# Patient Record
Sex: Male | Born: 1970 | Race: Black or African American | Hispanic: No | Marital: Single | Smoking: Current every day smoker
Health system: Southern US, Community
[De-identification: ages and names within clinical notes are randomized; demographics above are authoritative.]

## PROBLEM LIST (undated history)

## (undated) DIAGNOSIS — I10 Essential (primary) hypertension: Secondary | ICD-10-CM

---

## 2000-01-27 ENCOUNTER — Emergency Department (HOSPITAL_COMMUNITY): Admission: EM | Admit: 2000-01-27 | Discharge: 2000-01-27 | Payer: Self-pay | Admitting: Emergency Medicine

## 2000-01-27 ENCOUNTER — Encounter: Payer: Self-pay | Admitting: Emergency Medicine

## 2000-05-06 ENCOUNTER — Ambulatory Visit (HOSPITAL_COMMUNITY): Admission: RE | Admit: 2000-05-06 | Discharge: 2000-05-06 | Payer: Self-pay | Admitting: Orthopaedic Surgery

## 2000-05-20 ENCOUNTER — Ambulatory Visit (HOSPITAL_COMMUNITY): Admission: RE | Admit: 2000-05-20 | Discharge: 2000-05-20 | Payer: Self-pay | Admitting: Orthopaedic Surgery

## 2000-08-28 ENCOUNTER — Emergency Department (HOSPITAL_COMMUNITY): Admission: EM | Admit: 2000-08-28 | Discharge: 2000-08-28 | Payer: Self-pay | Admitting: Emergency Medicine

## 2000-08-28 ENCOUNTER — Encounter: Payer: Self-pay | Admitting: Emergency Medicine

## 2000-08-31 ENCOUNTER — Inpatient Hospital Stay (HOSPITAL_COMMUNITY): Admission: RE | Admit: 2000-08-31 | Discharge: 2000-09-01 | Payer: Self-pay | Admitting: Orthopaedic Surgery

## 2016-08-16 HISTORY — PX: HERNIA REPAIR: SHX51

## 2019-07-26 ENCOUNTER — Encounter (HOSPITAL_BASED_OUTPATIENT_CLINIC_OR_DEPARTMENT_OTHER): Payer: Self-pay | Admitting: *Deleted

## 2019-07-26 ENCOUNTER — Emergency Department (HOSPITAL_BASED_OUTPATIENT_CLINIC_OR_DEPARTMENT_OTHER): Payer: 59

## 2019-07-26 ENCOUNTER — Other Ambulatory Visit: Payer: Self-pay

## 2019-07-26 ENCOUNTER — Emergency Department (HOSPITAL_BASED_OUTPATIENT_CLINIC_OR_DEPARTMENT_OTHER)
Admission: EM | Admit: 2019-07-26 | Discharge: 2019-07-27 | Disposition: A | Discharge status: Home / Self Care | Payer: 59 | Attending: Emergency Medicine | Admitting: Emergency Medicine

## 2019-07-26 DIAGNOSIS — M5030 Other cervical disc degeneration, unspecified cervical region: Secondary | ICD-10-CM | POA: Insufficient documentation

## 2019-07-26 DIAGNOSIS — Y33XXXA Other specified events, undetermined intent, initial encounter: Secondary | ICD-10-CM | POA: Diagnosis not present

## 2019-07-26 DIAGNOSIS — Y929 Unspecified place or not applicable: Secondary | ICD-10-CM | POA: Diagnosis not present

## 2019-07-26 DIAGNOSIS — Y939 Activity, unspecified: Secondary | ICD-10-CM | POA: Diagnosis not present

## 2019-07-26 DIAGNOSIS — F17210 Nicotine dependence, cigarettes, uncomplicated: Secondary | ICD-10-CM | POA: Diagnosis not present

## 2019-07-26 DIAGNOSIS — S161XXA Strain of muscle, fascia and tendon at neck level, initial encounter: Secondary | ICD-10-CM | POA: Diagnosis not present

## 2019-07-26 DIAGNOSIS — Y999 Unspecified external cause status: Secondary | ICD-10-CM | POA: Insufficient documentation

## 2019-07-26 DIAGNOSIS — M25512 Pain in left shoulder: Secondary | ICD-10-CM

## 2019-07-26 DIAGNOSIS — S199XXA Unspecified injury of neck, initial encounter: Secondary | ICD-10-CM | POA: Diagnosis present

## 2019-07-26 MED ORDER — DIAZEPAM 5 MG PO TABS
5.0000 mg | ORAL_TABLET | Freq: Once | ORAL | Status: AC
  Administered 2019-07-26: 5 mg via ORAL
  Filled 2019-07-26: qty 1

## 2019-07-26 MED ORDER — KETOROLAC TROMETHAMINE 30 MG/ML IJ SOLN
30.0000 mg | Freq: Once | INTRAMUSCULAR | Status: AC
  Administered 2019-07-26: 23:00:00 30 mg via INTRAMUSCULAR
  Filled 2019-07-26: qty 1

## 2019-07-26 NOTE — ED Triage Notes (Signed)
Neck, shoulder and head pain x 4 days. Painful when he attempts to turn his head from side to side.

## 2019-07-27 MED ORDER — METHOCARBAMOL 500 MG PO TABS: 500.0000 mg | ORAL_TABLET | Freq: Three times a day (TID) | ORAL | 0 refills | Status: DC | PRN

## 2019-07-27 MED ORDER — DICLOFENAC SODIUM 1 % EX GEL: 2.0000 g | Freq: Four times a day (QID) | CUTANEOUS | 0 refills | Status: DC | PRN

## 2019-07-27 MED ORDER — IBUPROFEN 800 MG PO TABS: 800.0000 mg | ORAL_TABLET | Freq: Three times a day (TID) | ORAL | 0 refills | Status: DC | PRN

## 2019-07-27 MED ORDER — PREDNISONE 20 MG PO TABS: 40.0000 mg | ORAL_TABLET | Freq: Every day | ORAL | 0 refills | Status: AC

## 2019-07-27 NOTE — Discharge Instructions (Signed)

## 2019-07-27 NOTE — ED Provider Notes (Signed)
Emergency Department Provider Note   I have reviewed the triage vital signs and the nursing notes.   HISTORY  Chief Complaint No chief complaint on file.   HPI Adam Wood is a 48 y.o. male with no significant past medical history presents to the emergency department with neck, shoulder pain worse on the left.  Symptoms have been persistent over the past 4 days and are worse with movement or touching the area.  Patient denies any rash, fever, headaches.  Patient does have a physical job but denies any obvious injury at work or otherwise.  He denies any weakness or numbness in the upper or lower extremities.  No vision changes.  No radiation of symptoms or other modifying factors.  History reviewed. No pertinent past medical history.  There are no problems to display for this patient.   History reviewed. No pertinent surgical history.  Allergies Patient has no known allergies.  No family history on file.  Social History Social History   Tobacco Use  . Smoking status: Current Every Day Smoker  . Smokeless tobacco: Never Used  Substance Use Topics  . Alcohol use: Yes  . Drug use: Not Currently    Review of Systems  Constitutional: No fever/chills Cardiovascular: Denies chest pain. Respiratory: Denies shortness of breath. Gastrointestinal: No abdominal pain.  Musculoskeletal: Positive neck and left shoulder pain.  Skin: Negative for rash. Neurological: Negative for headaches, focal weakness or numbness.  10-point ROS otherwise negative.  ____________________________________________   PHYSICAL EXAM:  VITAL SIGNS: ED Triage Vitals  Enc Vitals Group     BP 07/26/19 2143 (!) 175/125     Pulse Rate 07/26/19 2143 80     Resp 07/26/19 2143 18     Temp 07/26/19 2143 98.7 F (37.1 C)     Temp Source 07/26/19 2143 Oral     SpO2 07/26/19 2143 99 %     Weight 07/26/19 2141 150 lb (68 kg)     Height 07/26/19 2141 5\' 8"  (1.727 m)   Constitutional: Alert and  oriented. Well appearing and in no acute distress. Eyes: Conjunctivae are normal.  Head: Atraumatic. Nose: No congestion/rhinnorhea. Mouth/Throat: Mucous membranes are moist.  Neck: No stridor. No cervical spine tenderness to palpation. Positive left para-cervical spine tenderness.  Cardiovascular: Normal rate, regular rhythm. Good peripheral circulation. Grossly normal heart sounds.   Respiratory: Normal respiratory effort.  No retractions. Lungs CTAB. Gastrointestinal: No distention.  Musculoskeletal: Patient with left paracervical and posterior shoulder tenderness.  The muscles of the left posterior shoulder feel tense/tight.  No rash appreciated.  Normal range of motion of the shoulder with some pain on movement.  No tenderness in the left upper extremity more distal. Neurologic:  Normal speech and language. No gross focal neurologic deficits are appreciated.  Skin:  Skin is warm, dry and intact. No rash noted.  ____________________________________________  EKG   EKG Interpretation  Date/Time:  Thursday July 26 2019 21:45:59 EST Ventricular Rate:  76 PR Interval:  152 QRS Duration: 80 QT Interval:  360 QTC Calculation: 405 R Axis:   -88 Text Interpretation: Normal sinus rhythm Left axis deviation Nonspecific ST changes. No prior EKG for comparison. No STEMI Confirmed by Alona BeneLong, Gladyce Mcray [phone]([id number]) on 07/26/2019 9:50:56 PM       ____________________________________________  RADIOLOGY  CT Cervical Spine Wo Contrast  Result Date: 07/26/2019 CLINICAL DATA:  Cervical neck, head, and shoulder pain for 4 days. Pain when turning head side to side. EXAM: CT CERVICAL SPINE WITHOUT CONTRAST TECHNIQUE:  Multidetector CT imaging of the cervical spine was performed without intravenous contrast. Multiplanar CT image reconstructions were also generated. COMPARISON:  None. FINDINGS: Alignment: Straightening of normal lordosis. No listhesis. Skull base and vertebrae: No acute fracture. No primary  bone lesion or focal pathologic process. Soft tissues and spinal canal: No prevertebral fluid or swelling. No visible canal hematoma. Disc levels: Disc space narrowing and endplate spurring from C4-C5 through C6-C7. There is left neural foraminal stenosis from C3-C4 through C6-C7. There is right foraminal stenosis at C5-C6 and C6-C7. No bony canal narrowing. Upper chest: Negative. Other: None. IMPRESSION: 1. Multilevel degenerative disc disease. Neural foraminal stenosis from C3-C4 through C6-C7 on the left. Neural foraminal stenosis on the right at C5-C6 and C6-C7. No bony canal narrowing. 2. Straightening of normal lordosis may be due to positioning or muscle spasm. 3. No acute osseous abnormality. Electronically Signed   By: Narda Rutherford M.D.   On: 07/26/2019 23:27   DG Shoulder Left  Result Date: 07/26/2019 CLINICAL DATA:  Shoulder pain for several days, no known injury, initial encounter EXAM: LEFT SHOULDER - 2+ VIEW COMPARISON:  None. FINDINGS: Degenerative changes of the acromioclavicular joint are seen. No acute fracture or dislocation is noted. No soft tissue abnormality is seen. No other focal abnormality is noted. IMPRESSION: No acute abnormality seen. Electronically Signed   By: Alcide Clever M.D.   On: 07/26/2019 23:25    ____________________________________________   PROCEDURES  Procedure(s) performed:   Procedures  None ____________________________________________   INITIAL IMPRESSION / ASSESSMENT AND PLAN / ED COURSE  Pertinent labs & imaging results that were available during my care of the patient were reviewed by me and considered in my medical decision making (see chart for details).   Patient presents to the emergency department with left neck and left shoulder pain.  No obvious injury.  No fevers to suspect infectious etiology.  EKG ordered in triage which was reviewed with no acute ischemic findings.  My suspicion for atypical ACS is exceedingly low.  The patient's  pain is reproducible on exam and with movement.  Patient given Valium and Toradol in the emergency department with mild improvement.  Will discharge home with Robaxin and topical pain medications.  Patient to follow-up with sports medicine if symptoms persist.  Also provided a work note.   ____________________________________________  FINAL CLINICAL IMPRESSION(S) / ED DIAGNOSES  Final diagnoses:  Strain of neck muscle, initial encounter  Acute pain of left shoulder  DDD (degenerative disc disease), cervical     MEDICATIONS GIVEN DURING THIS VISIT:  Medications  ketorolac (TORADOL) 30 MG/ML injection 30 mg (30 mg Intramuscular Given 07/26/19 2244)  diazepam (VALIUM) tablet 5 mg (5 mg Oral Given 07/26/19 2244)     NEW OUTPATIENT MEDICATIONS STARTED DURING THIS VISIT:  Discharge Medication List as of 07/27/2019 12:20 AM    START taking these medications   Details  diclofenac Sodium (VOLTAREN) 1 % GEL Apply 2 g topically 4 (four) times daily as needed., Starting Fri 07/27/2019, Print    ibuprofen (ADVIL) 800 MG tablet Take 1 tablet (800 mg total) by mouth every 8 (eight) hours as needed for moderate pain., Starting Fri 07/27/2019, Print    methocarbamol (ROBAXIN) 500 MG tablet Take 1 tablet (500 mg total) by mouth every 8 (eight) hours as needed for muscle spasms., Starting Fri 07/27/2019, Print    predniSONE (DELTASONE) 20 MG tablet Take 2 tablets (40 mg total) by mouth daily for 5 days., Starting Fri 07/27/2019, Until Wed 08/01/2019,  Print        Note:  This document was prepared using Dragon voice recognition software and may include unintentional dictation errors.  Nanda Quinton, MD, [facility] Emergency Medicine    Kohl Polinsky, Wonda Old[facility], MD 07/27/19 [phone]

## 2019-08-03 ENCOUNTER — Other Ambulatory Visit: Payer: Self-pay

## 2019-08-03 ENCOUNTER — Encounter (HOSPITAL_BASED_OUTPATIENT_CLINIC_OR_DEPARTMENT_OTHER): Payer: Self-pay | Admitting: *Deleted

## 2019-08-03 ENCOUNTER — Emergency Department (HOSPITAL_BASED_OUTPATIENT_CLINIC_OR_DEPARTMENT_OTHER)
Admission: EM | Admit: 2019-08-03 | Discharge: 2019-08-04 | Disposition: A | Discharge status: Home / Self Care | Payer: 59 | Attending: Emergency Medicine | Admitting: Emergency Medicine

## 2019-08-03 DIAGNOSIS — F17200 Nicotine dependence, unspecified, uncomplicated: Secondary | ICD-10-CM | POA: Insufficient documentation

## 2019-08-03 DIAGNOSIS — M5412 Radiculopathy, cervical region: Secondary | ICD-10-CM | POA: Insufficient documentation

## 2019-08-03 DIAGNOSIS — M25512 Pain in left shoulder: Secondary | ICD-10-CM | POA: Diagnosis present

## 2019-08-03 MED ORDER — OXYCODONE-ACETAMINOPHEN 5-325 MG PO TABS: 1.0000 | ORAL_TABLET | Freq: Four times a day (QID) | ORAL | 0 refills | Status: DC | PRN

## 2019-08-03 NOTE — ED Provider Notes (Signed)
[facility] Provider Note: [name], MD, FACEP  CSN: [id number] MRN: [id number] ARRIVAL: 08/03/19 at 2248 ROOM: MH12/[facility]12   CHIEF COMPLAINT  Shoulder Pain   HISTORY OF PRESENT ILLNESS  08/03/19 11:33 PM Adam Wood is a 48 y.o. male who was seen on 07/27/2019 for neck pain radiating to his left shoulder and upper arm.  The pain had been present for 4 days.  A CT scan showed bilateral degenerative neuroforaminal changes.  He was treated with prednisone, methocarbamol and ibuprofen.  He returns with incompletely relieved pain despite completing his prescriptions.  He has follow-up scheduled for 08/13/2019 but is requesting pain relief until then.  He describes the pain as sharp and moderate to severe in intensity.  It is worse with movement of his neck or left shoulder.  He has a subjective sense of weakness in the left upper extremity.  He denies radiation of pain or paresthesias to his forearm or fingers.  History reviewed. No pertinent past medical history.  History reviewed. No pertinent surgical history.  History reviewed. No pertinent family history.  Social History   Tobacco Use  . Smoking status: Current Every Day Smoker  . Smokeless tobacco: Never Used  Substance Use Topics  . Alcohol use: Yes  . Drug use: Not Currently    Prior to Admission medications   Medication Sig Start Date End Date Taking? Authorizing Provider  diclofenac Sodium (VOLTAREN) 1 % GEL Apply 2 g topically 4 (four) times daily as needed. 07/27/19   Long, Arlyss Repress, MD  ibuprofen (ADVIL) 800 MG tablet Take 1 tablet (800 mg total) by mouth every 8 (eight) hours as needed for moderate pain. 07/27/19   Long, Arlyss Repress, MD  methocarbamol (ROBAXIN) 500 MG tablet Take 1 tablet (500 mg total) by mouth every 8 (eight) hours as needed for muscle spasms. 07/27/19   Long, Arlyss Repress, MD    Allergies Patient has no known allergies.   REVIEW OF SYSTEMS  Negative except as noted here or in  the History of Present Illness.   PHYSICAL EXAMINATION  Initial Vital Signs Blood pressure (!) 150/100, pulse 82, temperature 98.3 F (36.8 C), temperature source Oral, resp. rate 18, height 5\' 8"  (1.727 m), weight 68 kg, SpO2 98 %.  Examination General: Well-developed, well-nourished male in no acute distress; appearance consistent with age of record HENT: normocephalic; atraumatic Eyes: Normal appearance Neck: supple but movement of the neck reproduces pain Heart: regular rate and rhythm Lungs: clear to auscultation bilaterally Abdomen: soft; nondistended; nontender; bowel sounds present Extremities: No deformity; full range of motion but extremes of flexion and extension in the upper extremities reproduces pain on the left Neurologic: Awake, alert and oriented; motor function intact in all extremities and symmetric; no facial droop Skin: Warm and dry Psychiatric: Normal mood and affect   RESULTS  Summary of this visit's results, reviewed and interpreted by myself:   EKG Interpretation  Date/Time:    Ventricular Rate:    PR Interval:    QRS Duration:   QT Interval:    QTC Calculation:   R Axis:     Text Interpretation:        Laboratory Studies: No results found for this or any previous visit (from the past 24 hour(s)). Imaging Studies: No results found.  ED COURSE and MDM  Nursing notes, initial and subsequent vitals signs, including pulse oximetry, reviewed and interpreted by myself.  Vitals:   08/03/19 2311 08/03/19 2312  BP: (!) 150/100  Pulse: 82   Resp: 18   Temp: 98.3 F (36.8 C)   TempSrc: Oral   SpO2: 98%   Weight:  68 kg  Height:  5\' 8"  (1.727 m)   Medications - No data to display  History and examination consistent with cervical radiculopathy.  He was advised to follow-up with the neurosurgery referral he was given.  We will try a short course of narcotic analgesia pending follow-up.   PROCEDURES  Procedures   ED DIAGNOSES      ICD-10-CM   1. Cervical radiculopathy  M54.12        Trevian Hayashida, Jenny Reichmann, MD 08/03/19 2344

## 2019-08-03 NOTE — ED Triage Notes (Signed)
C/o left shoulder pain , reports motrin/ robaxin not working , has appt with Neuro . Seen here 12/11 for same

## 2019-12-18 ENCOUNTER — Emergency Department (HOSPITAL_BASED_OUTPATIENT_CLINIC_OR_DEPARTMENT_OTHER)
Admission: EM | Admit: 2019-12-18 | Discharge: 2019-12-18 | Disposition: A | Discharge status: Home / Self Care | Payer: 59 | Attending: Emergency Medicine | Admitting: Emergency Medicine

## 2019-12-18 ENCOUNTER — Encounter (HOSPITAL_BASED_OUTPATIENT_CLINIC_OR_DEPARTMENT_OTHER): Payer: Self-pay | Admitting: Emergency Medicine

## 2019-12-18 ENCOUNTER — Other Ambulatory Visit: Payer: Self-pay

## 2019-12-18 DIAGNOSIS — J029 Acute pharyngitis, unspecified: Secondary | ICD-10-CM | POA: Diagnosis not present

## 2019-12-18 DIAGNOSIS — R070 Pain in throat: Secondary | ICD-10-CM | POA: Diagnosis present

## 2019-12-18 DIAGNOSIS — F17200 Nicotine dependence, unspecified, uncomplicated: Secondary | ICD-10-CM | POA: Insufficient documentation

## 2019-12-18 LAB — BASIC METABOLIC PANEL
Anion gap: 8 (ref 5–15)
BUN: 12 mg/dL (ref 6–20)
CO2: 24 mmol/L (ref 22–32)
Calcium: 9.4 mg/dL (ref 8.9–10.3)
Chloride: 103 mmol/L (ref 98–111)
Creatinine, Ser: 1.08 mg/dL (ref 0.61–1.24)
GFR calc Af Amer: >60 mL/min (ref >60)
GFR calc non Af Amer: >60 mL/min (ref >60)
Glucose, Bld: 100 mg/dL — ABNORMAL HIGH (ref 70–99)
Potassium: 3.9 mmol/L (ref 3.5–5.1)
Sodium: 135 mmol/L (ref 135–145)

## 2019-12-18 LAB — CBC WITH DIFFERENTIAL/PLATELET
Abs Immature Granulocytes: 0.01 10*3/uL (ref 0.00–0.07)
Basophils Absolute: 0 10*3/uL (ref 0.0–0.1)
Basophils Relative: 0 %
Eosinophils Absolute: 0.2 10*3/uL (ref 0.0–0.5)
Eosinophils Relative: 3 %
HCT: 47.4 % (ref 39.0–52.0)
Hemoglobin: 16.5 g/dL (ref 13.0–17.0)
Immature Granulocytes: 0 %
Lymphocytes Relative: 22 %
Lymphs Abs: 1.3 10*3/uL (ref 0.7–4.0)
MCH: 30.3 pg (ref 26.0–34.0)
MCHC: 34.8 g/dL (ref 30.0–36.0)
MCV: 87 fL (ref 80.0–100.0)
Monocytes Absolute: 0.5 10*3/uL (ref 0.1–1.0)
Monocytes Relative: 8 %
Neutro Abs: 4.1 10*3/uL (ref 1.7–7.7)
Neutrophils Relative %: 67 %
Platelets: 290 10*3/uL (ref 150–400)
RBC: 5.45 MIL/uL (ref 4.22–5.81)
RDW: 13.6 % (ref 11.5–15.5)
WBC: 6 10*3/uL (ref 4.0–10.5)
nRBC: 0 % (ref 0.0–0.2)

## 2019-12-18 LAB — GROUP A STREP BY PCR: Group A Strep by PCR: NOT DETECTED

## 2019-12-18 MED ORDER — DEXAMETHASONE SODIUM PHOSPHATE 10 MG/ML IJ SOLN
10.0000 mg | Freq: Once | INTRAMUSCULAR | Status: AC
  Administered 2019-12-18: 11:00:00 10 mg via INTRAVENOUS
  Filled 2019-12-18: qty 1

## 2019-12-18 MED ORDER — CLINDAMYCIN PHOSPHATE 600 MG/50ML IV SOLN
600.0000 mg | Freq: Once | INTRAVENOUS | Status: AC
  Administered 2019-12-18: 600 mg via INTRAVENOUS
  Filled 2019-12-18: qty 50

## 2019-12-18 MED ORDER — ONDANSETRON 4 MG PO TBDP: 4.0000 mg | ORAL_TABLET | Freq: Three times a day (TID) | ORAL | 0 refills | Status: DC | PRN

## 2019-12-18 MED ORDER — CLINDAMYCIN HCL 300 MG PO CAPS: 300.0000 mg | ORAL_CAPSULE | Freq: Three times a day (TID) | ORAL | 0 refills | Status: AC

## 2019-12-18 MED ORDER — SODIUM CHLORIDE 0.9 % IV BOLUS
1000.0000 mL | Freq: Once | INTRAVENOUS | Status: AC
  Administered 2019-12-18: 11:00:00 1000 mL via INTRAVENOUS

## 2019-12-18 MED FILL — ONDANSETRON ODT 4MG TBDP: 4 | 7 days supply | Qty: 20 | Fill #0

## 2019-12-18 MED FILL — CLINDAMYCIN HCL 300 MG CAP: 300 | 10 days supply | Qty: 30 | Fill #0

## 2019-12-18 NOTE — ED Provider Notes (Signed)
[place] [facility] EMERGENCY DEPARTMENT Provider Note   CSN: [id number] Arrival date & time: 12/18/19  6789     History Chief Complaint  Patient presents with  . Sore Throat    Al Bracewell is a 49 y.o. male.  HPI     Amara Manalang is a 49 y.o. male, patient with no pertinent past medical history, presenting to the ED with right-sided throat pain for the last 3 days. Pain is moderate to severe, sharp, nonradiating.  Denies fever/chills, nausea/vomiting, difficulty breathing, difficulty swallowing, chest pain, drooling, or any other complaints.   History reviewed. No pertinent past medical history.  There are no problems to display for this patient.   History reviewed. No pertinent surgical history.     History reviewed. No pertinent family history.  Social History   Tobacco Use  . Smoking status: Current Every Day Smoker  . Smokeless tobacco: Never Used  Substance Use Topics  . Alcohol use: Yes  . Drug use: Not Currently    Types: Marijuana    Home Medications Prior to Admission medications   Medication Sig Start Date End Date Taking? Authorizing Provider  clindamycin (CLEOCIN) 300 MG capsule Take 1 capsule (300 mg total) by mouth 3 (three) times daily for 10 days. 12/18/19 12/28/19  Lenea Bywater C, PA-C  ondansetron (ZOFRAN ODT) 4 MG disintegrating tablet Take 1 tablet (4 mg total) by mouth every 8 (eight) hours as needed for nausea or vomiting. 12/18/19   Secily Walthour C, PA-C  oxyCODONE-acetaminophen (PERCOCET) 5-325 MG tablet Take 1 tablet by mouth every 6 (six) hours as needed for severe pain. 08/03/19   Molpus, Jenny Reichmann, MD    Allergies    Patient has no known allergies.  Review of Systems   Review of Systems  Constitutional: Negative for chills and fever.  HENT: Positive for sore throat. Negative for trouble swallowing and voice change.   Respiratory: Negative for shortness of breath.   Cardiovascular: Negative for chest pain.  Gastrointestinal:  Negative for abdominal pain, nausea and vomiting.  Musculoskeletal: Negative for neck pain.  All other systems reviewed and are negative.   Physical Exam Updated Vital Signs BP (!) 146/106 (BP Location: Left Arm)   Pulse 77   Temp 98 F (36.7 C) (Oral)   Resp 16   Ht 5\' 8"  (1.727 m)   Wt 83.5 kg   SpO2 97%   BMI 27.98 kg/m   Physical Exam Vitals and nursing note reviewed.  Constitutional:      General: He is not in acute distress.    Appearance: He is well-developed. He is not diaphoretic.  HENT:     Head: Normocephalic and atraumatic.     Mouth/Throat:     Mouth: Mucous membranes are moist.     Comments: Erythema and swelling to the right peritonsillar region.  Fullness and tenderness to the region of the soft palate on the right. Dentition appears to be intact and stable. No trismus or noted abnormal phonation.  Mouth opening to at least 3 finger widths.  Handles oral secretions without difficulty.  No noted facial swelling.  No sublingual swelling or tongue elevation.  No swelling or tenderness to the submental or submandibular regions.  No swelling or tenderness into the soft tissues of the neck. Eyes:     Conjunctiva/sclera: Conjunctivae normal.  Cardiovascular:     Rate and Rhythm: Normal rate and regular rhythm.     Pulses: Normal pulses.  Radial pulses are 2+ on the right side and 2+ on the left side.  Pulmonary:     Effort: Pulmonary effort is normal. No respiratory distress.  Abdominal:     Tenderness: There is no guarding.  Musculoskeletal:     Cervical back: Neck supple.  Lymphadenopathy:     Cervical: Cervical adenopathy present.  Skin:    General: Skin is warm and dry.  Neurological:     Mental Status: He is alert.  Psychiatric:        Mood and Affect: Mood and affect normal.        Speech: Speech normal.        Behavior: Behavior normal.     ED Results / Procedures / Treatments   Labs (all labs ordered are listed, but only abnormal  results are displayed) Labs Reviewed  BASIC METABOLIC PANEL - Abnormal; Notable for the following components:      Result Value   Glucose, Bld 100 (*)    All other components within normal limits  GROUP A STREP BY PCR  CBC WITH DIFFERENTIAL/PLATELET    EKG None  Radiology No results found.  Procedures Procedures (including critical care time)  Medications Ordered in ED Medications  sodium chloride 0.9 % bolus 1,000 mL (1,000 mLs Intravenous New Bag/Given 12/18/19 1048)  clindamycin (CLEOCIN) IVPB 600 mg (600 mg Intravenous New Bag/Given 12/18/19 1056)  dexamethasone (DECADRON) injection 10 mg (10 mg Intravenous Given 12/18/19 1053)    ED Course  I have reviewed the triage vital signs and the nursing notes.  Pertinent labs & imaging results that were available during my care of the patient were reviewed by me and considered in my medical decision making (see chart for details).  Clinical Course as of Dec 17 1137  Tue Dec 18, 2019  1036 Spoke with Dr. Annalee Genta, ENT.Agrees with plan for IV fluids, clindamycin, and Decadron here in the ED. If patient nontoxic-appearing, no imaging necessary at this time. Discharge with 10 days clindamycin 300 mg 3 times daily, ibuprofen, and instructions for continued hydration. 2-week follow-up to assure resolution.  If worsening, patient can call the office to be seen sooner.   [SJ]    Clinical Course User Index [SJ] Kamuela Magos, Hillard Danker, PA-C   MDM Rules/Calculators/A&P                       Patient presents with right-sided sore throat. Patient is nontoxic appearing, afebrile, not tachycardic, not tachypneic, not hypotensive, excellent SPO2 on room air, and is in no apparent distress.  Concern for possible developing PTA. I reviewed and interpreted the patient's labs studies. No leukocytosis.  Maintaining airway and secretions. The patient was given instructions for home care as well as return precautions. Patient voices understanding of these  instructions, accepts the plan, and is comfortable with discharge.    Vitals:   12/18/19 0942 12/18/19 0952  BP: (!) 152/110 (!) 146/106  Pulse: 77   Resp: 16   Temp: 98 F (36.7 C)   TempSrc: Oral   SpO2: 97%   Weight: 83.5 kg   Height: 5\' 8"  (1.727 m)       Final Clinical Impression(s) / ED Diagnoses Final diagnoses:  Sore throat    Rx / DC Orders ED Discharge Orders         Ordered    clindamycin (CLEOCIN) 300 MG capsule  3 times daily     12/18/19 1040    ondansetron (ZOFRAN ODT) 4  MG disintegrating tablet  Every 8 hours PRN     12/18/19 1040           Anselm Pancoast, PA-C 12/18/19 1143    Terrilee Files, MD 12/18/19 [phone]

## 2019-12-18 NOTE — ED Notes (Signed)
Pharmacy and medications updated with patient 

## 2019-12-18 NOTE — ED Triage Notes (Signed)
Pt c/o right side throat pain. Pt denies fever, denies sinus congestion.

## 2019-12-18 NOTE — Discharge Instructions (Addendum)
Sore Throat  You have been seen today for sore throat, but we do have some suspicion for a possible developing abscess, or collection of infection.   Please take all of your antibiotics until finished!   You may develop abdominal discomfort or diarrhea from the antibiotic.  You may help offset this with probiotics which you can buy or get in yogurt. Do not eat or take the probiotics until 2 hours after your antibiotic.  Hand washing: Wash your hands throughout the day, but especially before and after touching the face, using the restroom, sneezing, coughing, or touching surfaces that have been coughed or sneezed upon. Hydration: Symptoms will be intensified and complicated by dehydration. Dehydration can also extend the duration of symptoms. Drink plenty of fluids and get plenty of rest. You should be drinking at least half a liter of water an hour to stay hydrated. Electrolyte drinks (ex. Gatorade, Powerade, Pedialyte) are also encouraged. You should be drinking enough fluids to make your urine light yellow, almost clear. If this is not the case, you are not drinking enough water. Please note that some of the treatments indicated below will not be effective if you are not adequately hydrated. Diet: Please concentrate on hydration, however, you may introduce food slowly.  Start with a clear liquid diet, progressed to a full liquid diet, and then bland solids as you are able. Pain or fever: Ibuprofen, Naproxen, or Tylenol for pain or fever. (see below for suggested regimen) Antiinflammatory medications: Take 600 mg of ibuprofen every 6 hours or 440 mg (over the counter dose) to 500 mg (prescription dose) of naproxen every 12 hours for the next 3 days. After this time, these medications may be used as needed for pain. Take these medications with food to avoid upset stomach. Choose only one of these medications, do not take them together. Tylenol: Should you continue to have additional pain while taking the  ibuprofen or naproxen, you may add in tylenol as needed. Your daily total maximum amount of tylenol from all sources should be limited to 4000mg /day for persons without liver problems, or 2000mg /day for those with liver problems. Sore throat: Warm liquids or Chloraseptic spray may help soothe a sore throat. Gargle twice a day with a salt water solution made from a half teaspoon of salt in a cup of warm water.  Nausea/vomiting: Use the ondansetron (generic for Zofran) for nausea or vomiting.  This medication may not prevent all vomiting or nausea, but can help facilitate better hydration. Things that can help with nausea/vomiting also include peppermint/menthol candies, vitamin B12, and ginger. Follow up: Follow-up with the ear nose and throat (ENT) specialist in 2 weeks to assure resolution.  If symptoms begin to worsen, however, please call the office to be seen sooner. Return: Return to the emergency department for difficulty breathing, drooling, chest pain, uncontrolled vomiting, inability to swallow liquids, or any other major concerns.  For prescription assistance, may try using prescription discount sites or apps, such as goodrx.com

## 2019-12-18 NOTE — ED Notes (Signed)
ED Provider at bedside. 

## 2021-02-24 ENCOUNTER — Other Ambulatory Visit (HOSPITAL_BASED_OUTPATIENT_CLINIC_OR_DEPARTMENT_OTHER): Payer: Self-pay

## 2021-02-24 ENCOUNTER — Other Ambulatory Visit: Payer: Self-pay

## 2021-02-24 ENCOUNTER — Encounter (HOSPITAL_BASED_OUTPATIENT_CLINIC_OR_DEPARTMENT_OTHER): Payer: Self-pay | Admitting: *Deleted

## 2021-02-24 ENCOUNTER — Emergency Department (HOSPITAL_BASED_OUTPATIENT_CLINIC_OR_DEPARTMENT_OTHER)
Admission: EM | Admit: 2021-02-24 | Discharge: 2021-02-24 | Disposition: A | Discharge status: Home / Self Care | Payer: 59 | Attending: Emergency Medicine | Admitting: Emergency Medicine

## 2021-02-24 DIAGNOSIS — F17200 Nicotine dependence, unspecified, uncomplicated: Secondary | ICD-10-CM | POA: Insufficient documentation

## 2021-02-24 DIAGNOSIS — U071 COVID-19: Secondary | ICD-10-CM | POA: Insufficient documentation

## 2021-02-24 DIAGNOSIS — I10 Essential (primary) hypertension: Secondary | ICD-10-CM | POA: Insufficient documentation

## 2021-02-24 LAB — CBC WITH DIFFERENTIAL/PLATELET
Abs Immature Granulocytes: 0 10*3/uL (ref 0.00–0.07)
Basophils Absolute: 0 10*3/uL (ref 0.0–0.1)
Basophils Relative: 0 %
Eosinophils Absolute: 0 10*3/uL (ref 0.0–0.5)
Eosinophils Relative: 0 %
HCT: 41.7 % (ref 39.0–52.0)
Hemoglobin: 14.1 g/dL (ref 13.0–17.0)
Immature Granulocytes: 0 %
Lymphocytes Relative: 14 %
Lymphs Abs: 0.5 10*3/uL — ABNORMAL LOW (ref 0.7–4.0)
MCH: 30.2 pg (ref 26.0–34.0)
MCHC: 33.8 g/dL (ref 30.0–36.0)
MCV: 89.3 fL (ref 80.0–100.0)
Monocytes Absolute: 0.6 10*3/uL (ref 0.1–1.0)
Monocytes Relative: 16 %
Neutro Abs: 2.8 10*3/uL (ref 1.7–7.7)
Neutrophils Relative %: 70 %
Platelets: 158 10*3/uL (ref 150–400)
RBC: 4.67 MIL/uL (ref 4.22–5.81)
RDW: 13.7 % (ref 11.5–15.5)
WBC: 3.9 10*3/uL — ABNORMAL LOW (ref 4.0–10.5)
nRBC: 0 % (ref 0.0–0.2)

## 2021-02-24 LAB — COMPREHENSIVE METABOLIC PANEL
ALT: 34 U/L (ref 0–44)
AST: 30 U/L (ref 15–41)
Albumin: 3.9 g/dL (ref 3.5–5.0)
Alkaline Phosphatase: 45 U/L (ref 38–126)
Anion gap: 6 (ref 5–15)
BUN: 13 mg/dL (ref 6–20)
CO2: 27 mmol/L (ref 22–32)
Calcium: 8.5 mg/dL — ABNORMAL LOW (ref 8.9–10.3)
Chloride: 104 mmol/L (ref 98–111)
Creatinine, Ser: 1.13 mg/dL (ref 0.61–1.24)
GFR, Estimated: >60 mL/min (ref >60)
Glucose, Bld: 110 mg/dL — ABNORMAL HIGH (ref 70–99)
Potassium: 3.8 mmol/L (ref 3.5–5.1)
Sodium: 137 mmol/L (ref 135–145)
Total Bilirubin: 0.3 mg/dL (ref 0.3–1.2)
Total Protein: 7.3 g/dL (ref 6.5–8.1)

## 2021-02-24 LAB — RESP PANEL BY RT-PCR (FLU A&B, COVID) ARPGX2
Influenza A by PCR: NEGATIVE
Influenza B by PCR: NEGATIVE
SARS Coronavirus 2 by RT PCR: POSITIVE — AB

## 2021-02-24 LAB — LIPASE, BLOOD: Lipase: 51 U/L (ref 11–51)

## 2021-02-24 MED ORDER — ACETAMINOPHEN 500 MG PO TABS
1000.0000 mg | ORAL_TABLET | Freq: Once | ORAL | Status: AC
  Administered 2021-02-24: 1000 mg via ORAL
  Filled 2021-02-24: qty 2

## 2021-02-24 MED ORDER — ONDANSETRON HCL 4 MG/2ML IJ SOLN
4.0000 mg | Freq: Once | INTRAMUSCULAR | Status: AC
  Administered 2021-02-24: 4 mg via INTRAVENOUS
  Filled 2021-02-24: qty 2

## 2021-02-24 MED ORDER — AMLODIPINE BESYLATE 10 MG PO TABS
10.0000 mg | ORAL_TABLET | Freq: Every day | ORAL | 0 refills | Status: DC
  Filled 2021-02-24: qty 30, 30d supply, fill #0

## 2021-02-24 MED ORDER — SODIUM CHLORIDE 0.9 % IV BOLUS
1000.0000 mL | Freq: Once | INTRAVENOUS | Status: AC
  Administered 2021-02-24: 1000 mL via INTRAVENOUS

## 2021-02-24 MED ORDER — NIRMATRELVIR/RITONAVIR (PAXLOVID)TABLET
3.0000 | ORAL_TABLET | Freq: Two times a day (BID) | ORAL | 0 refills | Status: AC
  Filled 2021-02-24: qty 30, 5d supply, fill #0

## 2021-02-24 NOTE — ED Notes (Signed)
Continues to c/o HA, orders rec from ED MD

## 2021-02-24 NOTE — ED Notes (Signed)
ED Provider at bedside. 

## 2021-02-24 NOTE — ED Notes (Signed)
BEFAST ASSESSMENT NEGATIVE VAN NEGATIVE

## 2021-02-24 NOTE — ED Triage Notes (Signed)
States last night he began having abd pain and HA, took Theraflu but vomited medication. Exposed to Covid at work

## 2021-02-24 NOTE — ED Notes (Signed)
Covid Swab to lab °

## 2021-02-24 NOTE — ED Provider Notes (Signed)
[facility] EMERGENCY DEPARTMENT Provider Note  CSN: [id number] Arrival date & time: 02/24/21 2841    History Chief Complaint  Patient presents with   Abdominal Pain    Adam Wood is a 50 y.o. male with no known medical problems, although he has been told his BP has been high, he is not under treatment. He reports he was in his normal state of health yesterday. Woke up around 2am with headache, chills, nausea and vomiting. He has had mid/upper abdominal pain since. Minimal improvement with Pepto or Theraflu. He reports a co-worker was diagnosed with Covid about a week ago. Drinks occasional EtOH but none in recent days.    History reviewed. No pertinent past medical history.  History reviewed. No pertinent surgical history.  History reviewed. No pertinent family history.  Social History   Tobacco Use   Smoking status: Every Day    Pack years: 0.00   Smokeless tobacco: Never  Substance Use Topics   Alcohol use: Yes   Drug use: Not Currently    Types: Marijuana     Home Medications Prior to Admission medications   Medication Sig Start Date End Date Taking? Authorizing Provider  amLODipine (NORVASC) 10 MG tablet Take 1 tablet (10 mg total) by mouth daily. 02/24/21 03/26/21 Yes Pollyann Savoy, MD  nirmatrelvir/ritonavir EUA (PAXLOVID) TABS Take 3 tablets by mouth 2 (two) times daily for 5 days. Patient GFR is <60. Take nirmatrelvir (150 mg) two tablets twice daily for 5 days and ritonavir (100 mg) one tablet twice daily for 5 days. 02/24/21 03/01/21 Yes Pollyann Savoy, MD     Allergies    Patient has no known allergies.   Review of Systems   Review of Systems A comprehensive review of systems was completed and negative except as noted in HPI.    Physical Exam BP (!) 168/109 (BP Location: Left Arm)   Pulse 74   Temp 99.1 F (37.3 C) (Oral)   Resp 20   Ht 5\' 8"  (1.727 m)   Wt 81.6 kg   SpO2 99%   BMI 27.37 kg/m   Physical Exam Vitals  and nursing note reviewed.  Constitutional:      Appearance: Normal appearance.  HENT:     Head: Normocephalic and atraumatic.     Nose: Nose normal.     Mouth/Throat:     Mouth: Mucous membranes are moist.  Eyes:     Extraocular Movements: Extraocular movements intact.     Conjunctiva/sclera: Conjunctivae normal.  Cardiovascular:     Rate and Rhythm: Normal rate.  Pulmonary:     Effort: Pulmonary effort is normal.     Breath sounds: Normal breath sounds.  Abdominal:     General: Abdomen is flat.     Palpations: Abdomen is soft.     Tenderness: There is abdominal tenderness in the epigastric area. There is no guarding. Negative signs include Murphy's sign and McBurney's sign.  Musculoskeletal:        General: No swelling. Normal range of motion.     Cervical back: Neck supple.  Skin:    General: Skin is warm and dry.  Neurological:     General: No focal deficit present.     Mental Status: He is alert.  Psychiatric:        Mood and Affect: Mood normal.     ED Results / Procedures / Treatments   Labs (all labs ordered are listed, but only abnormal results are displayed) Labs Reviewed  RESP PANEL BY RT-PCR (FLU A&B, COVID) ARPGX2 - Abnormal; Notable for the following components:      Result Value   SARS Coronavirus 2 by RT PCR POSITIVE (*)    All other components within normal limits  CBC WITH DIFFERENTIAL/PLATELET - Abnormal; Notable for the following components:   WBC 3.9 (*)    Lymphs Abs 0.5 (*)    All other components within normal limits  COMPREHENSIVE METABOLIC PANEL - Abnormal; Notable for the following components:   Glucose, Bld 110 (*)    Calcium 8.5 (*)    All other components within normal limits  LIPASE, BLOOD    EKG EKG Interpretation  Date/Time:  Tuesday February 24 2021 08:05:38 EDT Ventricular Rate:  66 PR Interval:  156 QRS Duration: 86 QT Interval:  382 QTC Calculation: 401 R Axis:   -77 Text Interpretation: Sinus rhythm Left anterior  fascicular block ST elev, probable normal early repol pattern No significant change since last tracing Confirmed by Susy Frizzle [phone]) on 02/24/2021 8:17:09 AM   Radiology No results found.  Procedures Procedures  Medications Ordered in the ED Medications  ondansetron (ZOFRAN) injection 4 mg (4 mg Intravenous Given 02/24/21 0826)  sodium chloride 0.9 % bolus 1,000 mL (0 mLs Intravenous Stopped 02/24/21 0926)  acetaminophen (TYLENOL) tablet 1,000 mg (1,000 mg Oral Given 02/24/21 1002)     MDM Rules/Calculators/A&P MDM  Patient with upper abdominal pain, nausea, and low grade fever. Will check labs, IVF, antiemetics. Covid swab.   ED Course  I have reviewed the triage vital signs and the nursing notes.  Pertinent labs & imaging results that were available during my care of the patient were reviewed by me and considered in my medical decision making (see chart for details).  Clinical Course as of 02/24/21 1005  Tue Feb 24, 2021  0835 CBC with mild lymphopenia.  [CS]  0850 CMP and lipase are normal.  [CS]  1003 Patient's Covid is positive. Will treat with Paxlovid given his, likely undiagnosed, HTN. Will also start on Norvasc and recommend close PCP follow up for long term management.  [CS]    Clinical Course User Index [CS] Pollyann Savoy, MD    Final Clinical Impression(s) / ED Diagnoses Final diagnoses:  COVID-19  Hypertension, unspecified type    Rx / DC Orders ED Discharge Orders          Ordered    amLODipine (NORVASC) 10 MG tablet  Daily        02/24/21 1005    nirmatrelvir/ritonavir EUA (PAXLOVID) TABS  2 times daily        02/24/21 1005             Pollyann Savoy, MD 02/24/21 1006

## 2021-02-24 NOTE — Discharge Instructions (Addendum)
Quarantine at home for at least 5 days, longer if symptoms are not improving or still running a fever. Return for any worsening symptoms or low oxygen level .

## 2021-02-28 IMAGING — CT CT CERVICAL SPINE W/O CM
3 of 4 series · 13 of 33 positions shown, 16 images · non-contrast
Comparison: None.

CLINICAL DATA: Cervical neck, head, and shoulder pain for 4 days.
Pain when turning head side to side.

EXAM:
CT CERVICAL SPINE WITHOUT CONTRAST
TECHNIQUE: Multidetector CT imaging of the cervical spine was performed without
intravenous contrast. Multiplanar CT image reconstructions were also
generated.

[Series 4: sagittal bone · sagittal · 0.29mm/px · 5 of 61 slices shown, 6 images]
[im 21/61  bone]
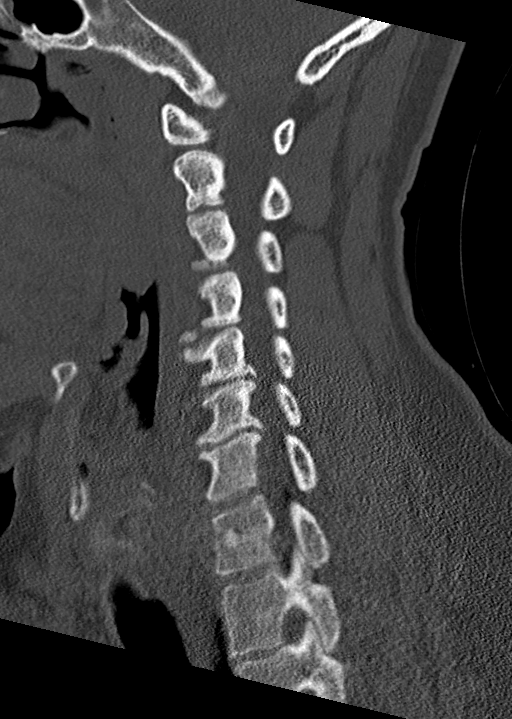
[im 26/61  bone]
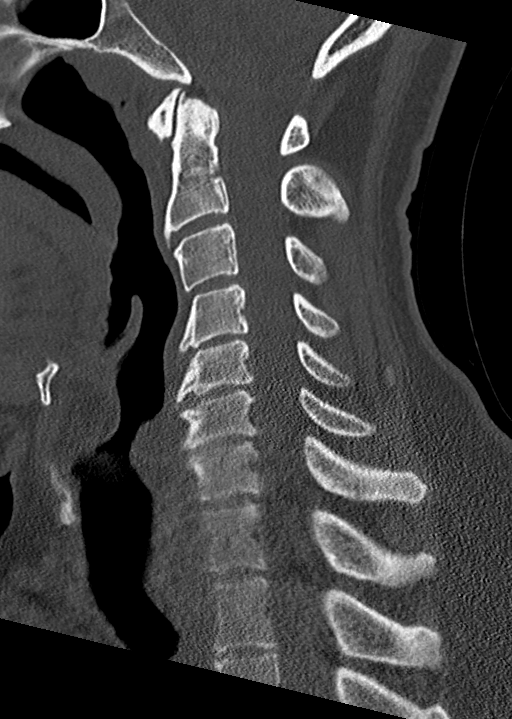
[im 31/61  soft-tissue]
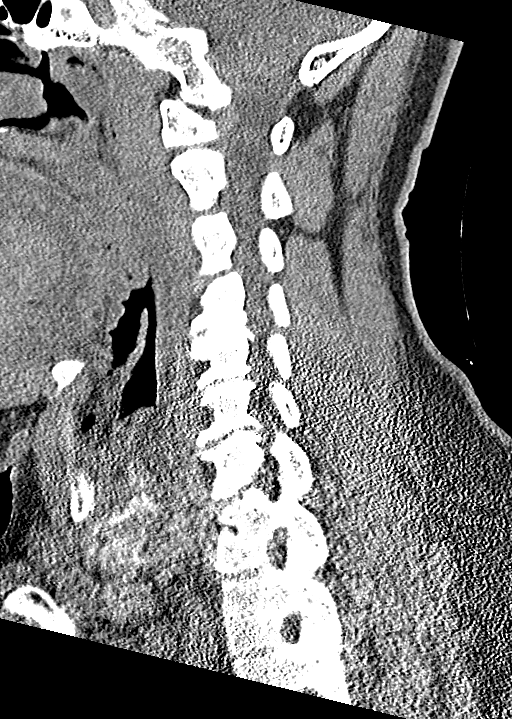
[im 31/61  bone]
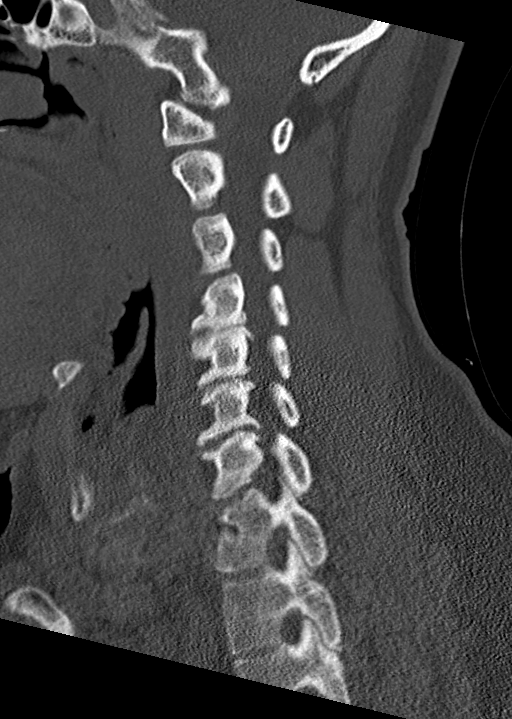
[im 36/61  bone]
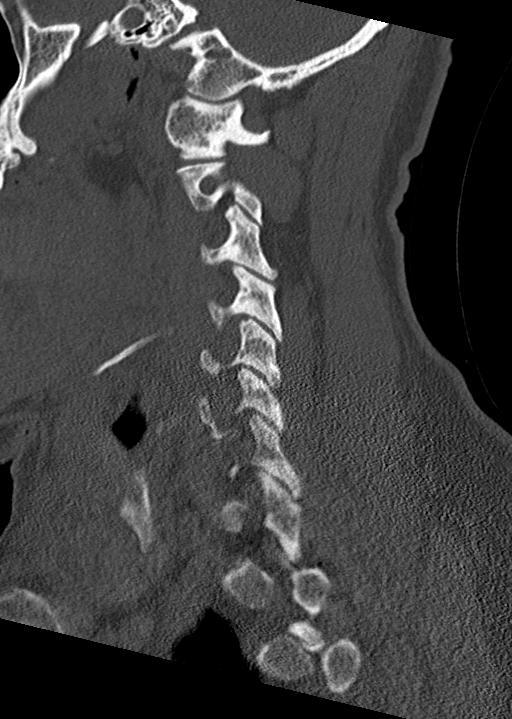
[im 41/61  bone]
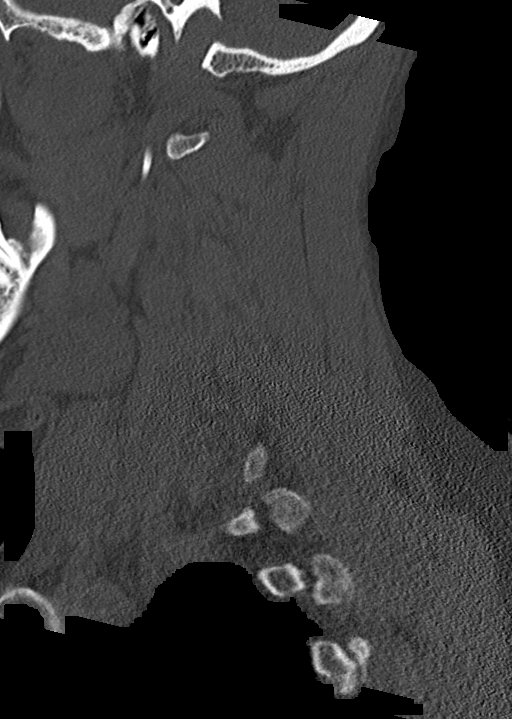

[Series 5: coronal bone · coronal · 0.28mm/px · 3 of 61 slices shown]
[im 13/61  bone]
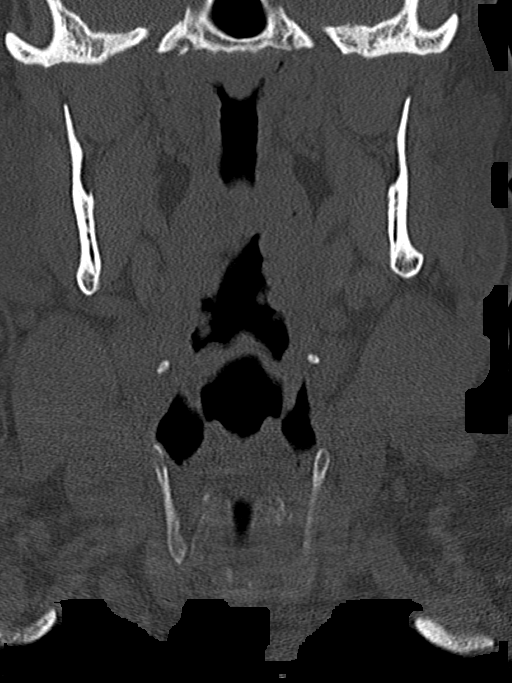
[im 25/61  bone]
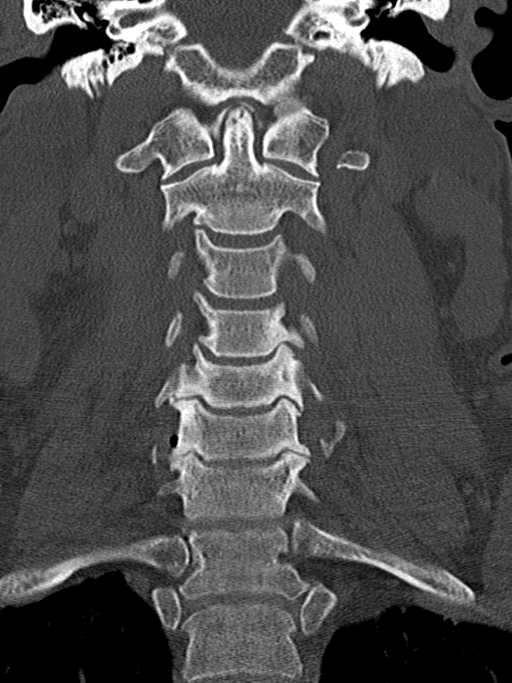
[im 37/61  bone]
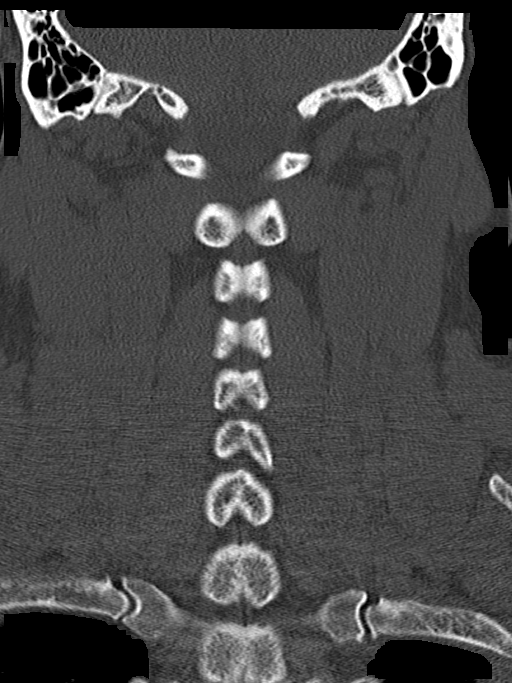

[Series 6: orthogonal bone · axial · 0.23mm/px · z∈[+1113,+1240]mm · 5 of 103 slices shown, 7 images]
[im 18/103  soft-tissue]
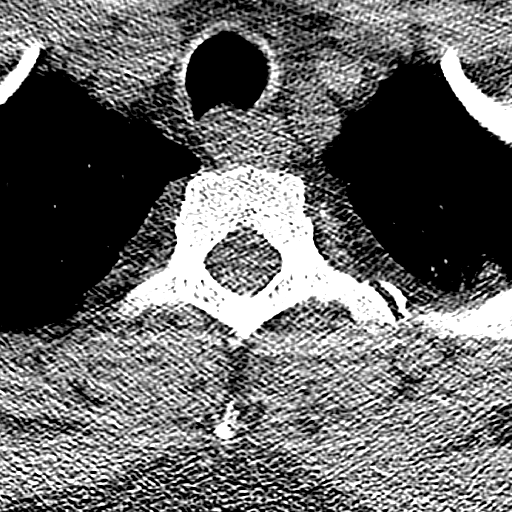
[im 18/103  bone]
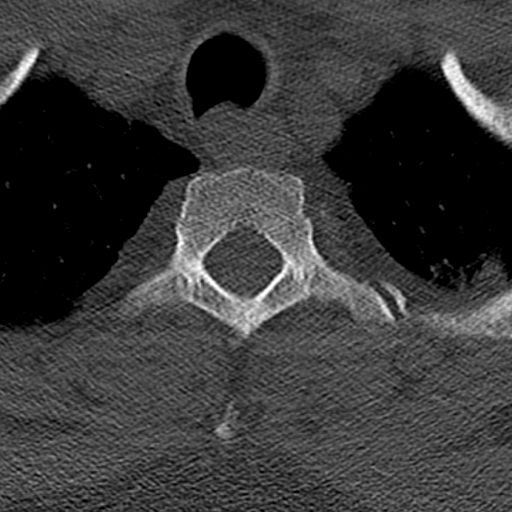
[im 35/103  bone]
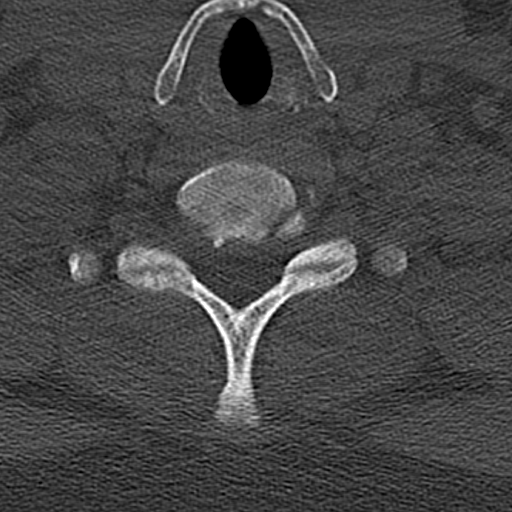
[im 52/103  bone]
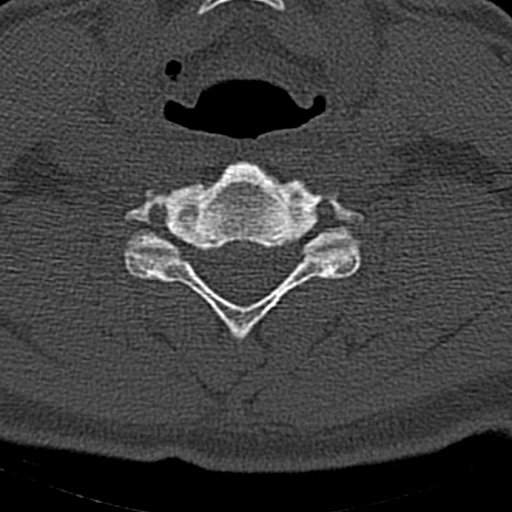
[im 69/103  bone]
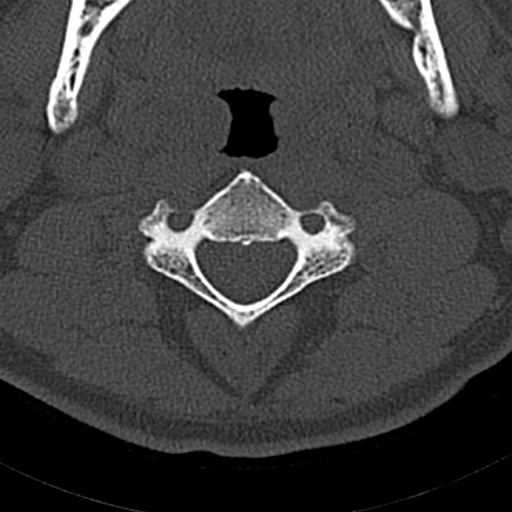
[im 86/103  soft-tissue]
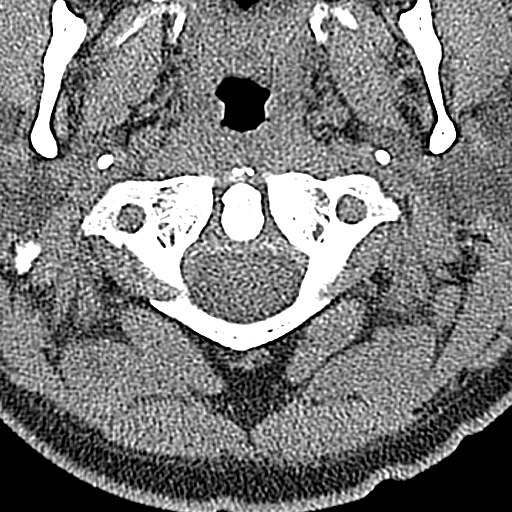
[im 86/103  bone]
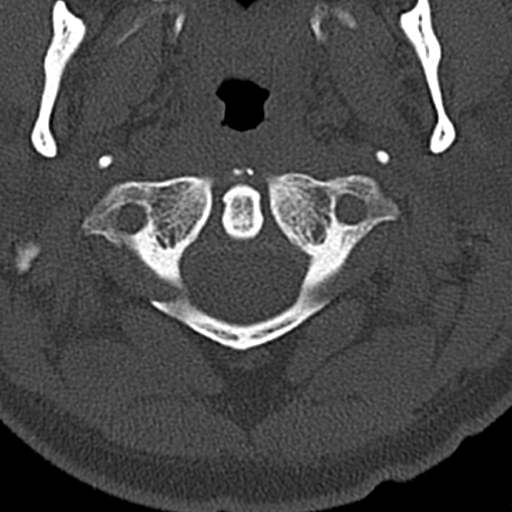

[13 of 33 positions shown; findings below may reference images not displayed]

FINDINGS: Alignment: Straightening of normal lordosis. No listhesis.

Skull base and vertebrae: No acute fracture. No primary bone lesion
or focal pathologic process.

Soft tissues and spinal canal: No prevertebral fluid or swelling. No
visible canal hematoma.

Disc levels: Disc space narrowing and endplate spurring from C4-C5
through C6-C7. There is left neural foraminal stenosis from C3-C4
through C6-C7. There is right foraminal stenosis at C5-C6 and C6-C7.
No bony canal narrowing.

Upper chest: Negative.

Other: None.
IMPRESSION: 1. Multilevel degenerative disc disease. Neural foraminal stenosis
from C3-C4 through C6-C7 on the left. Neural foraminal stenosis on
the right at C5-C6 and C6-C7. No bony canal narrowing.
2. Straightening of normal lordosis may be due to positioning or
muscle spasm.
3. No acute osseous abnormality.

## 2022-08-16 DIAGNOSIS — R059 Cough, unspecified: Secondary | ICD-10-CM | POA: Diagnosis not present

## 2022-08-16 DIAGNOSIS — Z20822 Contact with and (suspected) exposure to covid-19: Secondary | ICD-10-CM | POA: Diagnosis not present

## 2022-08-16 DIAGNOSIS — R69 Illness, unspecified: Secondary | ICD-10-CM | POA: Diagnosis not present

## 2022-08-16 DIAGNOSIS — R0981 Nasal congestion: Secondary | ICD-10-CM | POA: Diagnosis not present

## 2022-08-16 DIAGNOSIS — R509 Fever, unspecified: Secondary | ICD-10-CM | POA: Diagnosis not present

## 2022-08-16 DIAGNOSIS — R0602 Shortness of breath: Secondary | ICD-10-CM | POA: Diagnosis not present

## 2022-08-16 DIAGNOSIS — R519 Headache, unspecified: Secondary | ICD-10-CM | POA: Diagnosis not present

## 2022-08-16 DIAGNOSIS — R110 Nausea: Secondary | ICD-10-CM | POA: Diagnosis not present

## 2022-08-16 DIAGNOSIS — I10 Essential (primary) hypertension: Secondary | ICD-10-CM | POA: Diagnosis not present

## 2022-08-16 DIAGNOSIS — J101 Influenza due to other identified influenza virus with other respiratory manifestations: Secondary | ICD-10-CM | POA: Diagnosis not present

## 2023-06-17 DIAGNOSIS — I10 Essential (primary) hypertension: Secondary | ICD-10-CM | POA: Diagnosis not present

## 2023-06-17 DIAGNOSIS — Z0000 Encounter for general adult medical examination without abnormal findings: Secondary | ICD-10-CM | POA: Diagnosis not present

## 2023-06-27 DIAGNOSIS — K4090 Unilateral inguinal hernia, without obstruction or gangrene, not specified as recurrent: Secondary | ICD-10-CM | POA: Diagnosis not present

## 2023-07-25 NOTE — Pre-Procedure Instructions (Signed)
Surgical Instructions   Your procedure is scheduled on July 28, 2023. Report to [facility] Main Entrance "A" at 5:30 A.M., then check in with the Admitting office. Any questions or running late day of surgery: call [phone]  Questions prior to your surgery date: call [phone], Monday-Friday, 8am-4pm. If you experience any cold or flu symptoms such as cough, fever, chills, shortness of breath, etc. between now and your scheduled surgery, please notify us at the above number.     Remember:  Do not eat after midnight the night before your surgery   You may drink clear liquids until 4:15 AM the morning of your surgery.   Clear liquids allowed are: Water, Non-Citrus Juices (without pulp), Carbonated Beverages, Clear Tea (no milk, honey, etc.), Black Coffee Only (NO MILK, CREAM OR POWDERED CREAMER of any kind), and Gatorade.    Take these medicines the morning of surgery with A SIP OF WATER: Amlodipine (norvasc)   May take these medicines IF NEEDED:    One week prior to surgery, STOP taking any Aspirin (unless otherwise instructed by your surgeon) Aleve, Naproxen, Ibuprofen, Motrin, Advil, Goody's, BC's, all herbal medications, fish oil, and non-prescription vitamins.                     Do NOT Smoke (Tobacco/Vaping) for 24 hours prior to your procedure.  If you use a CPAP at night, you may bring your mask/headgear for your overnight stay.   You will be asked to remove any contacts, glasses, piercing's, hearing aid's, dentures/partials prior to surgery. Please bring cases for these items if needed.    Patients discharged the day of surgery will not be allowed to drive home, and someone needs to stay with them for 24 hours.  SURGICAL WAITING ROOM VISITATION Patients may have no more than 2 support people in the waiting area - these visitors may rotate.   Pre-op nurse will coordinate an appropriate time for 1 ADULT support person, who may not rotate, to accompany patient  in pre-op.  Children under the age of 44 must have an adult with them who is not the patient and must remain in the main waiting area with an adult.  If the patient needs to stay at the hospital during part of their recovery, the visitor guidelines for inpatient rooms apply.  Please refer to the [facility] website for the visitor guidelines for any additional information.   If you received a COVID test during your pre-op visit  it is requested that you wear a mask when out in public, stay away from anyone that may not be feeling well and notify your surgeon if you develop symptoms. If you have been in contact with anyone that has tested positive in the last 10 days please notify you surgeon.      Pre-operative CHG Bathing Instructions   You can play a key role in reducing the risk of infection after surgery. Your skin needs to be as free of germs as possible. You can reduce the number of germs on your skin by washing with CHG (chlorhexidine gluconate) soap before surgery. CHG is an antiseptic soap that kills germs and continues to kill germs even after washing.   DO NOT use if you have an allergy to chlorhexidine/CHG or antibacterial soaps. If your skin becomes reddened or irritated, stop using the CHG and notify one of our RNs at [phone].              TAKE A  SHOWER THE NIGHT BEFORE SURGERY AND THE DAY OF SURGERY    Please keep in mind the following:  DO NOT shave, including legs and underarms, 48 hours prior to surgery.   You may shave your face before/day of surgery.  Place clean sheets on your bed the night before surgery Use a clean washcloth (not used since being washed) for each shower. DO NOT sleep with pet's night before surgery.  CHG Shower Instructions:  Wash your face and private area with normal soap. If you choose to wash your hair, wash first with your normal shampoo.  After you use shampoo/soap, rinse your hair and body thoroughly to remove shampoo/soap residue.   Turn the water OFF and apply half the bottle of CHG soap to a CLEAN washcloth.  Apply CHG soap ONLY FROM YOUR NECK DOWN TO YOUR TOES (washing for 3-5 minutes)  DO NOT use CHG soap on face, private areas, open wounds, or sores.  Pay special attention to the area where your surgery is being performed.  If you are having back surgery, having someone wash your back for you may be helpful. Wait 2 minutes after CHG soap is applied, then you may rinse off the CHG soap.  Pat dry with a clean towel  Put on clean pajamas    Additional instructions for the day of surgery: DO NOT APPLY any lotions, deodorants, cologne, or perfumes.   Do not wear jewelry or makeup Do not wear nail polish, gel polish, artificial nails, or any other type of covering on natural nails (fingers and toes) Do not bring valuables to the hospital. [facility] is not responsible for valuables/personal belongings. Put on clean/comfortable clothes.  Please brush your teeth.  Ask your nurse before applying any prescription medications to the skin.

## 2023-07-26 ENCOUNTER — Ambulatory Visit: Payer: Self-pay | Admitting: General Surgery

## 2023-07-26 ENCOUNTER — Other Ambulatory Visit: Payer: Self-pay

## 2023-07-26 ENCOUNTER — Encounter (HOSPITAL_COMMUNITY): Payer: Self-pay

## 2023-07-26 ENCOUNTER — Encounter (HOSPITAL_COMMUNITY)
Admission: RE | Admit: 2023-07-26 | Discharge: 2023-07-26 | Disposition: A | Discharge status: Home / Self Care | Payer: PRIVATE HEALTH INSURANCE | Source: Ambulatory Visit | Attending: General Surgery | Admitting: General Surgery

## 2023-07-26 VITALS — BP 128/88 | HR 78 | Temp 98.4°F | Resp 18 | Ht 68.0 in | Wt 179.7 lb

## 2023-07-26 DIAGNOSIS — Z01818 Encounter for other preprocedural examination: Secondary | ICD-10-CM | POA: Insufficient documentation

## 2023-07-26 DIAGNOSIS — I10 Essential (primary) hypertension: Secondary | ICD-10-CM | POA: Diagnosis not present

## 2023-07-26 HISTORY — DX: Essential (primary) hypertension: I10

## 2023-07-26 LAB — BASIC METABOLIC PANEL
Anion gap: 8 (ref 5–15)
BUN: 9 mg/dL (ref 6–20)
CO2: 24 mmol/L (ref 22–32)
Calcium: 9.3 mg/dL (ref 8.9–10.3)
Chloride: 107 mmol/L (ref 98–111)
Creatinine, Ser: 1.15 mg/dL (ref 0.61–1.24)
GFR, Estimated: >60 mL/min (ref >60)
Glucose, Bld: 92 mg/dL (ref 70–99)
Potassium: 3.5 mmol/L (ref 3.5–5.1)
Sodium: 139 mmol/L (ref 135–145)

## 2023-07-26 LAB — CBC
HCT: 42.3 % (ref 39.0–52.0)
Hemoglobin: 14.1 g/dL (ref 13.0–17.0)
MCH: 28.3 pg (ref 26.0–34.0)
MCHC: 33.3 g/dL (ref 30.0–36.0)
MCV: 84.8 fL (ref 80.0–100.0)
Platelets: 223 10*3/uL (ref 150–400)
RBC: 4.99 MIL/uL (ref 4.22–5.81)
RDW: 13.6 % (ref 11.5–15.5)
WBC: 5.5 10*3/uL (ref 4.0–10.5)
nRBC: 0 % (ref 0.0–0.2)

## 2023-07-26 NOTE — Progress Notes (Signed)
Pt is not here for PAT appt. Cannot be reached via phone call. Will let scheduler know to see if the appt can be rescheduled.

## 2023-07-26 NOTE — Progress Notes (Signed)
Surgical Instructions     Your procedure is scheduled on July 28, 2023. Report to [facility] Main Entrance "A" at 5:30 A.M., then check in with the Admitting office. Any questions or running late day of surgery: call [phone]   Questions prior to your surgery date: call [phone], Monday-Friday, 8am-4pm. If you experience any cold or flu symptoms such as cough, fever, chills, shortness of breath, etc. between now and your scheduled surgery, please notify us at the above number.            Remember:       Do not eat after midnight the night before your surgery     You may drink clear liquids until 4:15 AM the morning of your surgery.   Clear liquids allowed are: Water, Non-Citrus Juices (without pulp), Carbonated Beverages, Clear Tea (no milk, honey, etc.), Black Coffee Only (NO MILK, CREAM OR POWDERED CREAMER of any kind), and Gatorade.  Patient Instructions  The night before surgery:  No food after midnight. ONLY clear liquids after midnight  The day of surgery (if you do NOT have diabetes):  Drink ONE (1) Pre-Surgery Clear Ensure by 0415 am the morning of surgery. Drink in one sitting. Do not sip.  This drink was given to you during your hospital  pre-op appointment visit.  Nothing else to drink after completing the  Pre-Surgery Clear Ensure.          If you have questions, please contact your surgeon's office.           Take these medicines the morning of surgery with A SIP OF WATER: Amlodipine (norvasc)     May take these medicines IF NEEDED:       One week prior to surgery, STOP taking any Aspirin (unless otherwise instructed by your surgeon) Aleve, Naproxen, Ibuprofen, Motrin, Advil, Goody's, BC's, all herbal medications, fish oil, and non-prescription vitamins.                     Do NOT Smoke (Tobacco/Vaping) for 24 hours prior to your procedure.   If you use a CPAP at night, you may bring your mask/headgear for your overnight stay.   You will be  asked to remove any contacts, glasses, piercing's, hearing aid's, dentures/partials prior to surgery. Please bring cases for these items if needed.    Patients discharged the day of surgery will not be allowed to drive home, and someone needs to stay with them for 24 hours.   SURGICAL WAITING ROOM VISITATION Patients may have no more than 2 support people in the waiting area - these visitors may rotate.   Pre-op nurse will coordinate an appropriate time for 1 ADULT support person, who may not rotate, to accompany patient in pre-op.  Children under the age of 46 must have an adult with them who is not the patient and must remain in the main waiting area with an adult.   If the patient needs to stay at the hospital during part of their recovery, the visitor guidelines for inpatient rooms apply.   Please refer to the [facility] website for the visitor guidelines for any additional information.     If you received a COVID test during your pre-op visit  it is requested that you wear a mask when out in public, stay away from anyone that may not be feeling well and notify your surgeon if you develop symptoms. If you have been in contact with anyone that has tested positive  in the last 10 days please notify you surgeon.         Pre-operative CHG Bathing Instructions    You can play a key role in reducing the risk of infection after surgery. Your skin needs to be as free of germs as possible. You can reduce the number of germs on your skin by washing with CHG (chlorhexidine gluconate) soap before surgery. CHG is an antiseptic soap that kills germs and continues to kill germs even after washing.    DO NOT use if you have an allergy to chlorhexidine/CHG or antibacterial soaps. If your skin becomes reddened or irritated, stop using the CHG and notify one of our RNs at [phone].               TAKE A SHOWER THE NIGHT BEFORE SURGERY AND THE DAY OF SURGERY     Please keep in mind the following:  DO  NOT shave, including legs and underarms, 48 hours prior to surgery.   You may shave your face before/day of surgery.  Place clean sheets on your bed the night before surgery Use a clean washcloth (not used since being washed) for each shower. DO NOT sleep with pet's night before surgery.   CHG Shower Instructions:  Wash your face and private area with normal soap. If you choose to wash your hair, wash first with your normal shampoo.  After you use shampoo/soap, rinse your hair and body thoroughly to remove shampoo/soap residue.  Turn the water OFF and apply half the bottle of CHG soap to a CLEAN washcloth.  Apply CHG soap ONLY FROM YOUR NECK DOWN TO YOUR TOES (washing for 3-5 minutes)  DO NOT use CHG soap on face, private areas, open wounds, or sores.  Pay special attention to the area where your surgery is being performed.  If you are having back surgery, having someone wash your back for you may be helpful. Wait 2 minutes after CHG soap is applied, then you may rinse off the CHG soap.  Pat dry with a clean towel  Put on clean pajamas     Additional instructions for the day of surgery: DO NOT APPLY any lotions, deodorants, cologne, or perfumes.   Do not wear jewelry or makeup Do not wear nail polish, gel polish, artificial nails, or any other type of covering on natural nails (fingers and toes) Do not bring valuables to the hospital. [facility] is not responsible for valuables/personal belongings. Put on clean/comfortable clothes.  Please brush your teeth.  Ask your nurse before applying any prescription medications to the skin.

## 2023-07-26 NOTE — Progress Notes (Signed)
PCP - Sabino Dick, DO Cardiologist - denies  PPM/ICD - denies Device Orders - n/a Rep Notified - n/a  Chest x-ray - denies EKG - 07/26/2023 Stress Test - denies ECHO - denies Cardiac Cath - denies  Sleep Study - denies CPAP - n/a  Fasting Blood Sugar - no DM Checks Blood Sugar _____ times a day  Last dose of GLP1 agonist-  n/a GLP1 instructions: n/a  Blood Thinner Instructions: n/a Aspirin Instructions: n/a  ERAS Protcol - yes, till 0415 am PRE-SURGERY Ensure or G2- Ensure  COVID TEST- n/a   Anesthesia review: no  Patient denies shortness of breath, fever, cough and chest pain at PAT appointment   All instructions explained to the patient, with a verbal understanding of the material. Patient agrees to go over the instructions while at home for a better understanding. Patient also instructed to self quarantine after being tested for COVID-19. The opportunity to ask questions was provided.

## 2023-07-27 NOTE — Anesthesia Preprocedure Evaluation (Signed)
Anesthesia Evaluation  Patient identified by MRN, date of birth, ID band Patient awake    Reviewed: Allergy & Precautions, NPO status , Patient's Chart, lab work & pertinent test results  History of Anesthesia Complications Negative for: history of anesthetic complications  Airway Mallampati: II  TM Distance: >3 FB Neck ROM: Full    Dental  (+) Missing, Chipped,    Pulmonary Current Smoker   Pulmonary exam normal        Cardiovascular hypertension, Pt. on medications Normal cardiovascular exam     Neuro/Psych negative neurological ROS  negative psych ROS   GI/Hepatic Neg liver ROS,,,Right inguinal hernia   Endo/Other  negative endocrine ROS    Renal/GU negative Renal ROS  negative genitourinary   Musculoskeletal negative musculoskeletal ROS (+)    Abdominal   Peds  Hematology negative hematology ROS (+)   Anesthesia Other Findings Day of surgery medications reviewed with patient.  Reproductive/Obstetrics negative OB ROS                              Anesthesia Physical Anesthesia Plan  ASA: 2  Anesthesia Plan: General   Post-op Pain Management: Tylenol PO (pre-op)*   Induction: Intravenous  PONV Risk Score and Plan: 1 and Ondansetron, Dexamethasone, Treatment may vary due to age or medical condition and Midazolam  Airway Management Planned: Oral ETT  Additional Equipment: None  Intra-op Plan:   Post-operative Plan: Extubation in OR  Informed Consent: I have reviewed the patients History and Physical, chart, labs and discussed the procedure including the risks, benefits and alternatives for the proposed anesthesia with the patient or authorized representative who has indicated his/her understanding and acceptance.     Dental advisory given  Plan Discussed with: CRNA  Anesthesia Plan Comments:         Anesthesia Quick Evaluation

## 2023-07-28 ENCOUNTER — Ambulatory Visit (HOSPITAL_BASED_OUTPATIENT_CLINIC_OR_DEPARTMENT_OTHER): Payer: PRIVATE HEALTH INSURANCE | Admitting: Anesthesiology

## 2023-07-28 ENCOUNTER — Other Ambulatory Visit: Payer: Self-pay

## 2023-07-28 ENCOUNTER — Ambulatory Visit (HOSPITAL_COMMUNITY): Payer: Self-pay | Admitting: Anesthesiology

## 2023-07-28 ENCOUNTER — Ambulatory Visit (HOSPITAL_COMMUNITY)
Admission: RE | Admit: 2023-07-28 | Discharge: 2023-07-28 | Disposition: A | Discharge status: Home / Self Care | Payer: PRIVATE HEALTH INSURANCE | Attending: General Surgery | Admitting: General Surgery

## 2023-07-28 ENCOUNTER — Encounter (HOSPITAL_COMMUNITY): Payer: Self-pay | Admitting: General Surgery

## 2023-07-28 ENCOUNTER — Encounter (HOSPITAL_COMMUNITY)
Admission: RE | Disposition: A | Discharge status: Home / Self Care | Payer: Self-pay | Source: Home / Self Care | Attending: General Surgery

## 2023-07-28 DIAGNOSIS — K4090 Unilateral inguinal hernia, without obstruction or gangrene, not specified as recurrent: Secondary | ICD-10-CM | POA: Diagnosis not present

## 2023-07-28 DIAGNOSIS — D176 Benign lipomatous neoplasm of spermatic cord: Secondary | ICD-10-CM | POA: Insufficient documentation

## 2023-07-28 DIAGNOSIS — K4091 Unilateral inguinal hernia, without obstruction or gangrene, recurrent: Secondary | ICD-10-CM | POA: Diagnosis not present

## 2023-07-28 HISTORY — PX: INGUINAL HERNIA REPAIR: SHX194

## 2023-07-28 SURGERY — REPAIR, HERNIA, INGUINAL, LAPAROSCOPIC
Anesthesia: General | Laterality: Right

## 2023-07-28 MED ORDER — OXYCODONE HCL 5 MG PO TABS: 5.0000 mg | ORAL_TABLET | Freq: Once | ORAL | Status: DC | PRN

## 2023-07-28 MED ORDER — DEXMEDETOMIDINE HCL IN NACL 80 MCG/20ML IV SOLN
INTRAVENOUS | Status: DC | PRN
  Administered 2023-07-28: 8 ug via INTRAVENOUS

## 2023-07-28 MED ORDER — CEFAZOLIN SODIUM-DEXTROSE 2-4 GM/100ML-% IV SOLN
2.0000 g | INTRAVENOUS | Status: AC
  Administered 2023-07-28: 2 g via INTRAVENOUS
  Filled 2023-07-28: qty 100

## 2023-07-28 MED ORDER — ROCURONIUM BROMIDE 10 MG/ML (PF) SYRINGE
PREFILLED_SYRINGE | INTRAVENOUS | Status: AC
  Filled 2023-07-28: qty 10

## 2023-07-28 MED ORDER — PROPOFOL 10 MG/ML IV BOLUS
INTRAVENOUS | Status: AC
  Filled 2023-07-28: qty 20

## 2023-07-28 MED ORDER — LIDOCAINE 2% (20 MG/ML) 5 ML SYRINGE
INTRAMUSCULAR | Status: AC
  Filled 2023-07-28: qty 5

## 2023-07-28 MED ORDER — DROPERIDOL 2.5 MG/ML IJ SOLN: 0.6250 mg | Freq: Once | INTRAMUSCULAR | Status: DC | PRN

## 2023-07-28 MED ORDER — CHLORHEXIDINE GLUCONATE CLOTH 2 % EX PADS: 6.0000 | MEDICATED_PAD | Freq: Once | CUTANEOUS | Status: DC

## 2023-07-28 MED ORDER — CHLORHEXIDINE GLUCONATE 0.12 % MT SOLN
15.0000 mL | Freq: Once | OROMUCOSAL | Status: AC
  Administered 2023-07-28: 15 mL via OROMUCOSAL
  Filled 2023-07-28: qty 15

## 2023-07-28 MED ORDER — LACTATED RINGERS IV SOLN: INTRAVENOUS | Status: DC

## 2023-07-28 MED ORDER — LABETALOL HCL 5 MG/ML IV SOLN
INTRAVENOUS | Status: DC | PRN
  Administered 2023-07-28 (×2): 5 mg via INTRAVENOUS

## 2023-07-28 MED ORDER — FENTANYL CITRATE (PF) 100 MCG/2ML IJ SOLN
INTRAMUSCULAR | Status: AC
  Filled 2023-07-28: qty 2

## 2023-07-28 MED ORDER — LIDOCAINE 2% (20 MG/ML) 5 ML SYRINGE
INTRAMUSCULAR | Status: DC | PRN
  Administered 2023-07-28: 100 mg via INTRAVENOUS

## 2023-07-28 MED ORDER — ROCURONIUM BROMIDE 10 MG/ML (PF) SYRINGE
PREFILLED_SYRINGE | INTRAVENOUS | Status: DC | PRN
  Administered 2023-07-28: 80 mg via INTRAVENOUS

## 2023-07-28 MED ORDER — ENSURE PRE-SURGERY PO LIQD: 296.0000 mL | Freq: Once | ORAL | Status: DC

## 2023-07-28 MED ORDER — FENTANYL CITRATE (PF) 250 MCG/5ML IJ SOLN
INTRAMUSCULAR | Status: AC
  Filled 2023-07-28: qty 5

## 2023-07-28 MED ORDER — DEXAMETHASONE SODIUM PHOSPHATE 10 MG/ML IJ SOLN
INTRAMUSCULAR | Status: AC
  Filled 2023-07-28: qty 1

## 2023-07-28 MED ORDER — MIDAZOLAM HCL 2 MG/2ML IJ SOLN
INTRAMUSCULAR | Status: AC
  Filled 2023-07-28: qty 2

## 2023-07-28 MED ORDER — PROPOFOL 10 MG/ML IV BOLUS
INTRAVENOUS | Status: DC | PRN
  Administered 2023-07-28: 150 mg via INTRAVENOUS

## 2023-07-28 MED ORDER — MIDAZOLAM HCL 2 MG/2ML IJ SOLN
INTRAMUSCULAR | Status: DC | PRN
  Administered 2023-07-28: 2 mg via INTRAVENOUS

## 2023-07-28 MED ORDER — SUGAMMADEX SODIUM 200 MG/2ML IV SOLN
INTRAVENOUS | Status: DC | PRN
  Administered 2023-07-28: 300 mg via INTRAVENOUS

## 2023-07-28 MED ORDER — LABETALOL HCL 5 MG/ML IV SOLN
INTRAVENOUS | Status: AC
  Filled 2023-07-28: qty 4

## 2023-07-28 MED ORDER — 0.9 % SODIUM CHLORIDE (POUR BTL) OPTIME
TOPICAL | Status: DC | PRN
  Administered 2023-07-28: 1000 mL

## 2023-07-28 MED ORDER — ONDANSETRON HCL 4 MG/2ML IJ SOLN
INTRAMUSCULAR | Status: DC | PRN
  Administered 2023-07-28: 4 mg via INTRAVENOUS

## 2023-07-28 MED ORDER — ACETAMINOPHEN 500 MG PO TABS
1000.0000 mg | ORAL_TABLET | ORAL | Status: AC
  Administered 2023-07-28: 1000 mg via ORAL
  Filled 2023-07-28: qty 2

## 2023-07-28 MED ORDER — TRAMADOL HCL 50 MG PO TABS: 50.0000 mg | ORAL_TABLET | Freq: Four times a day (QID) | ORAL | 0 refills | Status: AC | PRN

## 2023-07-28 MED ORDER — BUPIVACAINE-EPINEPHRINE (PF) 0.25% -1:200000 IJ SOLN
INTRAMUSCULAR | Status: AC
  Filled 2023-07-28: qty 30

## 2023-07-28 MED ORDER — ONDANSETRON HCL 4 MG/2ML IJ SOLN
INTRAMUSCULAR | Status: AC
Start: 2023-07-28 — End: ?
  Filled 2023-07-28: qty 2

## 2023-07-28 MED ORDER — FENTANYL CITRATE (PF) 100 MCG/2ML IJ SOLN
25.0000 ug | INTRAMUSCULAR | Status: DC | PRN
  Administered 2023-07-28 (×2): 50 ug via INTRAVENOUS

## 2023-07-28 MED ORDER — BUPIVACAINE HCL 0.25 % IJ SOLN
INTRAMUSCULAR | Status: DC | PRN
  Administered 2023-07-28: 6 mL

## 2023-07-28 MED ORDER — FENTANYL CITRATE (PF) 250 MCG/5ML IJ SOLN
INTRAMUSCULAR | Status: DC | PRN
  Administered 2023-07-28 (×3): 50 ug via INTRAVENOUS
  Administered 2023-07-28: 100 ug via INTRAVENOUS

## 2023-07-28 MED ORDER — ORAL CARE MOUTH RINSE: 15.0000 mL | Freq: Once | OROMUCOSAL | Status: AC

## 2023-07-28 MED ORDER — DEXAMETHASONE SODIUM PHOSPHATE 10 MG/ML IJ SOLN
INTRAMUSCULAR | Status: DC | PRN
  Administered 2023-07-28: 10 mg via INTRAVENOUS

## 2023-07-28 MED ORDER — OXYCODONE HCL 5 MG/5ML PO SOLN: 5.0000 mg | Freq: Once | ORAL | Status: DC | PRN

## 2023-07-28 SURGICAL SUPPLY — 36 items
BAG COUNTER SPONGE SURGICOUNT (BAG) ×1 IMPLANT
CANISTER SUCT 3000ML PPV (MISCELLANEOUS) IMPLANT
COVER SURGICAL LIGHT HANDLE (MISCELLANEOUS) ×1 IMPLANT
DERMABOND ADVANCED .7 DNX12 (GAUZE/BANDAGES/DRESSINGS) ×1 IMPLANT
DISSECTOR BLUNT TIP ENDO 5MM (MISCELLANEOUS) IMPLANT
ELECT REM PT RETURN 9FT ADLT (ELECTROSURGICAL) ×1 IMPLANT
ELECTRODE REM PT RTRN 9FT ADLT (ELECTROSURGICAL) ×1 IMPLANT
GLOVE BIO SURGEON STRL SZ7.5 (GLOVE) ×2 IMPLANT
GOWN STRL REUS W/ TWL LRG LVL3 (GOWN DISPOSABLE) ×2 IMPLANT
GOWN STRL REUS W/ TWL XL LVL3 (GOWN DISPOSABLE) ×1 IMPLANT
IRRIG SUCT STRYKERFLOW 2 WTIP (MISCELLANEOUS) IMPLANT
IRRIGATION SUCT STRKRFLW 2 WTP (MISCELLANEOUS) IMPLANT
KIT BASIN OR (CUSTOM PROCEDURE TRAY) ×1 IMPLANT
KIT TURNOVER KIT B (KITS) ×1 IMPLANT
MESH 3DMAX 5X7 RT XLRG (Mesh General) IMPLANT
NDL INSUFFLATION 14GA 120MM (NEEDLE) IMPLANT
NEEDLE INSUFFLATION 14GA 120MM (NEEDLE) IMPLANT
NS IRRIG 1000ML POUR BTL (IV SOLUTION) ×1 IMPLANT
PAD ARMBOARD 7.5X6 YLW CONV (MISCELLANEOUS) ×2 IMPLANT
RELOAD STAPLE 4.0 BLU F/HERNIA (INSTRUMENTS) IMPLANT
RELOAD STAPLE 4.8 BLK F/HERNIA (STAPLE) IMPLANT
RELOAD STAPLE HERNIA 4.0 BLUE (INSTRUMENTS) ×1 IMPLANT
RELOAD STAPLE HERNIA 4.8 BLK (STAPLE) IMPLANT
SCISSORS LAP 5X35 DISP (ENDOMECHANICALS) ×1 IMPLANT
SET TUBE SMOKE EVAC HIGH FLOW (TUBING) ×1 IMPLANT
STAPLER HERNIA 12 8.5 360D (INSTRUMENTS) IMPLANT
SUT MNCRL AB 4-0 PS2 18 (SUTURE) ×1 IMPLANT
SUT VIC AB 1 CT1 27XBRD ANBCTR (SUTURE) IMPLANT
TOWEL GREEN STERILE (TOWEL DISPOSABLE) ×1 IMPLANT
TOWEL GREEN STERILE FF (TOWEL DISPOSABLE) ×1 IMPLANT
TRAY LAPAROSCOPIC MC (CUSTOM PROCEDURE TRAY) ×1 IMPLANT
TROCAR OPTICAL SHORT 5MM (TROCAR) ×1 IMPLANT
TROCAR OPTICAL SLV SHORT 5MM (TROCAR) ×1 IMPLANT
TROCAR Z THREAD OPTICAL 12X100 (TROCAR) ×1 IMPLANT
WARMER LAPAROSCOPE (MISCELLANEOUS) ×1 IMPLANT
WATER STERILE IRR 1000ML POUR (IV SOLUTION) ×1 IMPLANT

## 2023-07-28 NOTE — Anesthesia Procedure Notes (Addendum)
Procedure Name: Intubation Date/Time: 07/28/2023 7:27 AM  Performed by: Sharyn Dross, CRNAPre-anesthesia Checklist: Patient identified, Emergency Drugs available, Suction available and Patient being monitored Patient Re-evaluated:Patient Re-evaluated prior to induction Oxygen Delivery Method: Circle system utilized Preoxygenation: Pre-oxygenation with 100% oxygen Induction Type: IV induction Ventilation: Mask ventilation without difficulty Laryngoscope Size: Mac and 4 Grade View: Grade II Tube type: Oral Tube size: 7.5 mm Number of attempts: 1 Airway Equipment and Method: Stylet and Oral airway Placement Confirmation: ETT inserted through vocal cords under direct vision, positive ETCO2 and breath sounds checked- equal and bilateral Secured at: 23 cm Tube secured with: Tape Dental Injury: Teeth and Oropharynx as per pre-operative assessment

## 2023-07-28 NOTE — Transfer of Care (Signed)
Immediate Anesthesia Transfer of Care Note  Patient: Adam Wood  Procedure(s) Performed: LAPAROSCOPIC RIGHT INGUINAL HERNIA REPAIR WITH MESH (Right)  Patient Location: PACU  Anesthesia Type:General  Level of Consciousness: awake, alert , and oriented  Airway & Oxygen Therapy: Patient connected to face mask oxygen  Post-op Assessment: Report given to RN, Post -op Vital signs reviewed and stable, and Patient moving all extremities X 4  Post vital signs: Reviewed and stable  Last Vitals:  Vitals Value Taken Time  BP 170/106 07/28/23 0832  Temp    Pulse 77 07/28/23 0833  Resp 16 07/28/23 0833  SpO2 99 % 07/28/23 0833  Vitals shown include unfiled device data.  Last Pain:  Vitals:   07/28/23 0624  TempSrc:   PainSc: 5       Patients Stated Pain Goal: 1 (07/28/23 7829)  Complications: There were no known notable events for this encounter.

## 2023-07-28 NOTE — Discharge Instructions (Signed)

## 2023-07-28 NOTE — H&P (Signed)
Chief Complaint: New Consultation   History of Present Illness: Adam Wood is a 52 y.o. male who is seen today as an office consultation at the request of Dr. Melba Coon for evaluation of New Consultation .  Patient is a 52 year old male who comes in secondary to a recurrent right inguinal hernia.  He states that there has been there approximately for 10 to 11 months. He states that it does get smaller and larger at times. He states that he is unaware of anything that caused the hernia.  He had a previous open right inguinal hernia repair 2018.  Patient's had no signs or symptoms of incarceration or strangulation.  Review of Systems: A complete review of systems was obtained from the patient. I have reviewed this information and discussed as appropriate with the patient. See HPI as well for other ROS.  Review of Systems  Constitutional: Negative for fever.  HENT: Negative for congestion.  Eyes: Negative for blurred vision.  Respiratory: Negative for cough, shortness of breath and wheezing.  Cardiovascular: Negative for chest pain and palpitations.  Gastrointestinal: Negative for heartburn.  Genitourinary: Negative for dysuria.  Musculoskeletal: Negative for myalgias.  Skin: Negative for rash.  Neurological: Negative for dizziness and headaches.  Psychiatric/Behavioral: Negative for depression and suicidal ideas.  All other systems reviewed and are negative.   Medical History: Past Medical History:  Diagnosis Date  Hyperlipidemia  Hypertension   There is no problem list on file for this patient.  Past Surgical History:  Procedure Laterality Date  HERNIA REPAIR    No Known Allergies  Current Outpatient Medications on File Prior to Visit  Medication Sig Dispense Refill  amLODIPine (NORVASC) 5 MG tablet Take 5 mg by mouth once daily  oxyCODONE (ROXICODONE) 5 MG immediate release tablet 1 TABLET AS NEEDED FOR SEVERE BREAKTHROUGH PAIN ORALLY EVERY 12 HOURS 14 DAYS   rosuvastatin (CRESTOR) 10 MG tablet 1 tablet Orally Once a day   No current facility-administered medications on file prior to visit.   Family History  Problem Relation Age of Onset  Breast cancer Sister    Social History   Tobacco Use  Smoking Status Every Day  Current packs/day: 0.50  Types: Cigarettes  Smokeless Tobacco Never    Social History   Socioeconomic History  Marital status: Single  Tobacco Use  Smoking status: Every Day  Current packs/day: 0.50  Types: Cigarettes  Smokeless tobacco: Never  Substance and Sexual Activity  Alcohol use: Yes  Drug use: Never   Objective:   BP (!) 141/96   Pulse 67   Temp 98.1 F (36.7 C) (Oral)   Resp 17   Ht 5\' 8"  (1.727 m)   Wt 81.6 kg   SpO2 98%   BMI 27.37 kg/m    Body mass index is 27.1 kg/m. Physical Exam Constitutional:  Appearance: Normal appearance.  HENT:  Head: Normocephalic and atraumatic.  Nose: Nose normal. No congestion.  Mouth/Throat:  Mouth: Mucous membranes are moist.  Pharynx: Oropharynx is clear.  Eyes:  Pupils: Pupils are equal, round, and reactive to light.  Cardiovascular:  Rate and Rhythm: Normal rate and regular rhythm.  Pulses: Normal pulses.  Heart sounds: Normal heart sounds. No murmur heard. No friction rub. No gallop.  Pulmonary:  Effort: Pulmonary effort is normal. No respiratory distress.  Breath sounds: Normal breath sounds. No stridor. No wheezing, rhonchi or rales.  Abdominal:  General: Abdomen is flat.  Hernia: A hernia is present. Hernia is present in the left inguinal area and  right inguinal area.  Musculoskeletal:  General: Normal range of motion.  Cervical back: Normal range of motion.  Skin: General: Skin is warm and dry.  Neurological:  General: No focal deficit present.  Mental Status: He is alert and oriented to person, place, and time.  Psychiatric:  Mood and Affect: Mood normal.  Thought Content: Thought content normal.     Assessment and Plan:   Diagnoses and all orders for this visit:  Unilateral inguinal hernia without obstruction or gangrene, recurrence not specified   Adam Wood is a 52 y.o. male   We will proceed to the OR for a laparoscopic right inguinal hernia repair with mesh. All risks and benefits were discussed with the patient, to generally include infection, bleeding, damage to surrounding structures, acute and chronic nerve pain, and recurrence. Alternatives were offered and described. All questions were answered and the patient voiced understanding of the procedure and wishes to proceed at this point.  No follow-ups on file.  Axel Filler, MD, [facility], [place] [facility]

## 2023-07-28 NOTE — Anesthesia Postprocedure Evaluation (Signed)
Anesthesia Post Note  Patient: Adam Wood  Procedure(s) Performed: LAPAROSCOPIC RIGHT INGUINAL HERNIA REPAIR WITH MESH (Right)     Patient location during evaluation: PACU Anesthesia Type: General Level of consciousness: awake and alert Pain management: pain level controlled Vital Signs Assessment: post-procedure vital signs reviewed and stable Respiratory status: spontaneous breathing, nonlabored ventilation and respiratory function stable Cardiovascular status: blood pressure returned to baseline Postop Assessment: no apparent nausea or vomiting Anesthetic complications: no   There were no known notable events for this encounter.  Last Vitals:  Vitals:   07/28/23 0900 07/28/23 0915  BP: (!) 149/109 (!) 154/111  Pulse: 69 72  Resp: 13 15  Temp:  37.2 C  SpO2: 96% 96%              Shanda Howells

## 2023-07-28 NOTE — Op Note (Signed)
07/28/2023  8:16 AM  PATIENT:  Adam Wood  52 y.o. male  PRE-OPERATIVE DIAGNOSIS:  RECURRENT RIGHT INGUINAL HERNIA  POST-OPERATIVE DIAGNOSIS: RECURRENT RIGHT INGUINAL, INDIRECT HERNIA, CORD LIPOMA  PROCEDURE:  Procedure(s): LAPAROSCOPIC RIGHT INGUINAL HERNIA REPAIR WITH MESH (Right)  SURGEON:  Surgeons and Role:    * Axel Filler, MD - Primary  ASSISTANTS: Rockwell [place], RNFA   ANESTHESIA:   local and general  EBL:  minimal   BLOOD ADMINISTERED:none  DRAINS: none   LOCAL MEDICATIONS USED:  BUPIVICAINE   SPECIMEN:  No Specimen  DISPOSITION OF SPECIMEN:  N/A  COUNTS:  YES  TOURNIQUET:  * No tourniquets in log *  DICTATION: .Dragon Dictation  Counts: reported as correct x 2  Findings:  The patient had a  recurrent right indirect hernia and a moderated sized cord lipoma  Indications for procedure:  The patient is a 52 year old male with a recurrent right inguinal hernia for several months. Patient complained of symptomatology to his right inguinal area. The patient was taken back for elective inguinal hernia repair.  Details of the procedure: The patient was taken back to the operating room. The patient was placed in supine position with bilateral SCDs in place.  The patient was prepped and draped in the usual sterile fashion.  After appropriate anitbiotics were confirmed, a time-out was confirmed and all facts were verified.  0.25% Marcaine was used to infiltrate the umbilical area. A 11-blade was used to cut down the skin and blunt dissection was used to get the anterior fashion.  The anterior fascia was incised approximately 1 cm and the muscles were retracted laterally. Blunt dissection was then used to create a space in the preperitoneal area. At this time a 10 mm camera was then introduced into the space and advanced the pubic tubercle and a 12 mm trocar was placed over this and insufflation was started.  At this time and space was created from medial to  laterally the preperitoneal space.  Cooper's ligament was initially cleaned off.  The hernia sac was identified in the indirect space. Dissection of the hernia sac and cord structures was undertaken the vas deferens was identified and protected in all parts of the case.  There was some dense scar tissue that was associated with the cord dissection. There was also a moderate cord lipoma that was dissected from the inguinal canal.  Once the hernia sac was taken down to approximately the umbilicus a Bard 3D Max mesh, size: Barney Drain, was  introduced into the preperitoneal space.  The mesh was brought over to cover the direct and indirect hernia spaces.  This was anchored into place and secured to Cooper's ligament with 4.50mm staples from a [place] hernia stapler. It was anchored to the anterior abdominal wall with 4.8 mm staples. The hernia sac was seen lying posterior to the mesh. There was no staples placed laterally. The insufflation was evacuated and the peritoneum was seen posterior to the mesh. The trochars were removed. The anterior fascia was reapproximated using #1 Vicryl on a UR- 6.  Intra-abdominal air was evacuated and the Veress needle removed. The skin was reapproximated using 4-0 Monocryl subcuticular fashion and Dermabond. The patient was awakened from general anesthesia and taken to recovery in stable condition.   PLAN OF CARE: Discharge to home after PACU  PATIENT DISPOSITION:  PACU - hemodynamically stable.   Delay start of Pharmacological VTE agent (>24hrs) due to surgical blood loss or risk of bleeding: not applicable

## 2023-07-29 ENCOUNTER — Encounter (HOSPITAL_COMMUNITY): Payer: Self-pay | Admitting: General Surgery
